# Patient Record
Sex: Female | Born: 1985
Health system: Southern US, Community
[De-identification: ages and names within clinical notes are randomized; demographics above are authoritative.]

## PROBLEM LIST (undated history)

## (undated) HISTORY — PX: NO PAST SURGERIES: SHX2092

---

## 2005-08-10 ENCOUNTER — Ambulatory Visit (HOSPITAL_COMMUNITY): Admission: RE | Admit: 2005-08-10 | Discharge: 2005-08-10 | Payer: Self-pay | Admitting: Gynecology

## 2005-08-14 ENCOUNTER — Ambulatory Visit: Payer: Self-pay | Admitting: Family Medicine

## 2005-09-11 ENCOUNTER — Ambulatory Visit: Payer: Self-pay | Admitting: Family Medicine

## 2005-10-09 ENCOUNTER — Ambulatory Visit: Payer: Self-pay | Admitting: Family Medicine

## 2005-10-23 ENCOUNTER — Ambulatory Visit: Payer: Self-pay | Admitting: Family Medicine

## 2005-11-06 ENCOUNTER — Ambulatory Visit: Payer: Self-pay | Admitting: Family Medicine

## 2005-11-20 ENCOUNTER — Ambulatory Visit: Payer: Self-pay | Admitting: Family Medicine

## 2005-12-11 ENCOUNTER — Ambulatory Visit: Payer: Self-pay | Admitting: Family Medicine

## 2005-12-18 ENCOUNTER — Ambulatory Visit: Payer: Self-pay | Admitting: Family Medicine

## 2005-12-25 ENCOUNTER — Ambulatory Visit: Payer: Self-pay | Admitting: Family Medicine

## 2006-01-08 ENCOUNTER — Inpatient Hospital Stay (HOSPITAL_COMMUNITY): Admission: AD | Admit: 2006-01-08 | Discharge: 2006-01-09 | Payer: Self-pay | Admitting: Obstetrics & Gynecology

## 2006-01-08 ENCOUNTER — Ambulatory Visit: Payer: Self-pay | Admitting: *Deleted

## 2006-02-19 ENCOUNTER — Ambulatory Visit: Payer: Self-pay | Admitting: Family Medicine

## 2006-11-19 ENCOUNTER — Encounter: Payer: Self-pay | Admitting: Family Medicine

## 2006-11-19 ENCOUNTER — Ambulatory Visit: Payer: Self-pay | Admitting: Family Medicine

## 2007-11-10 IMAGING — US US OB COMP +14 WK
1 series · 13 of 28 positions shown · non-contrast
Comparison: none

CLINICAL DATA: 17 week 5 day gestational age by LMP.  Evaluate dating and anatomy.

[Series 1: us ob comp +14 wk · 0.29mm/px · 13 of 79 slices shown]
[im 3/79]
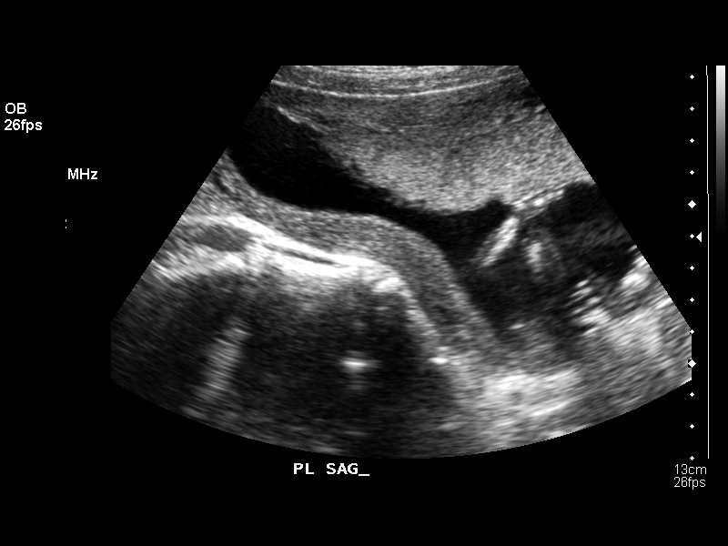
[im 9/79]
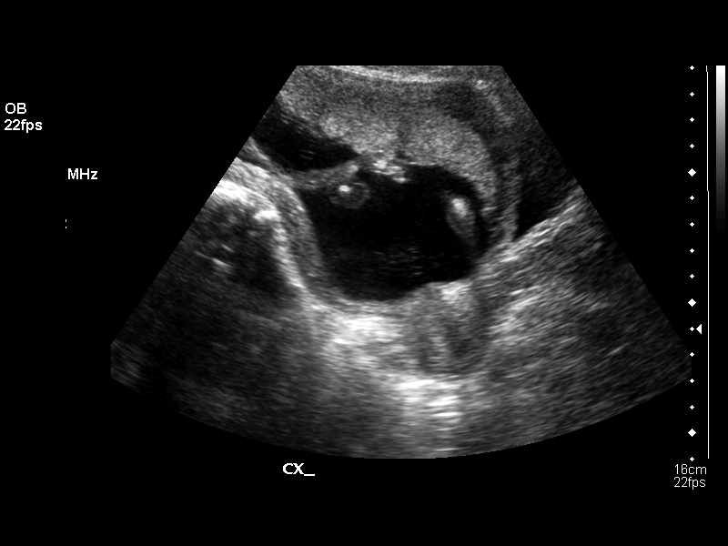
[im 15/79]
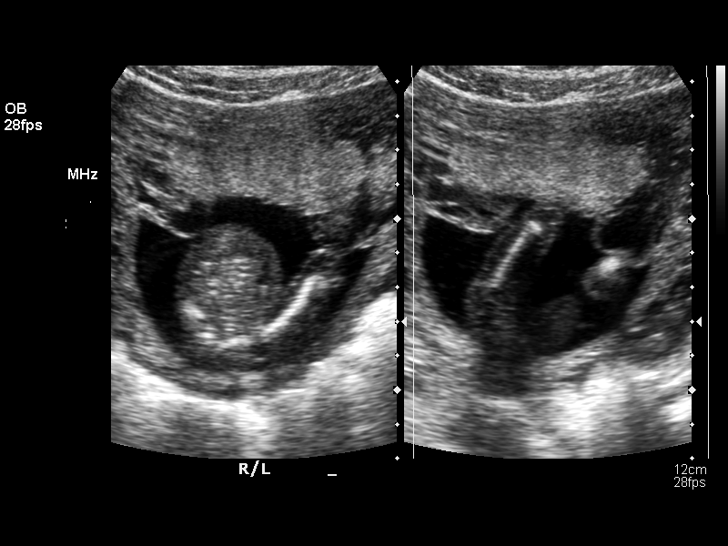
[im 21/79]
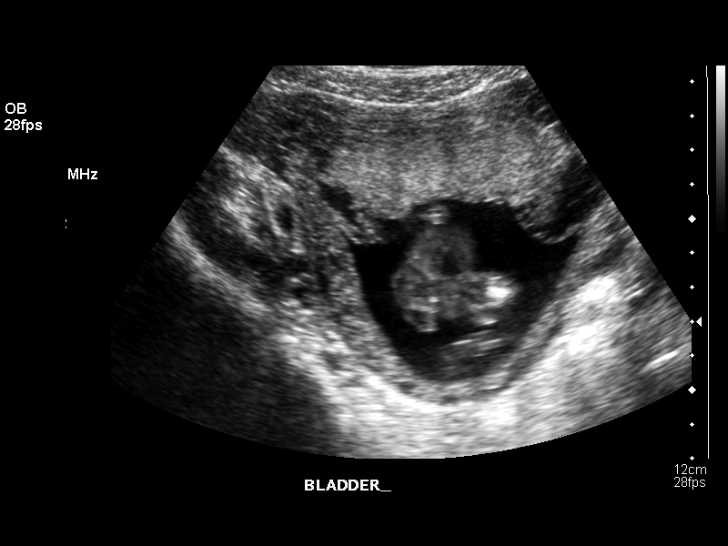
[im 27/79]
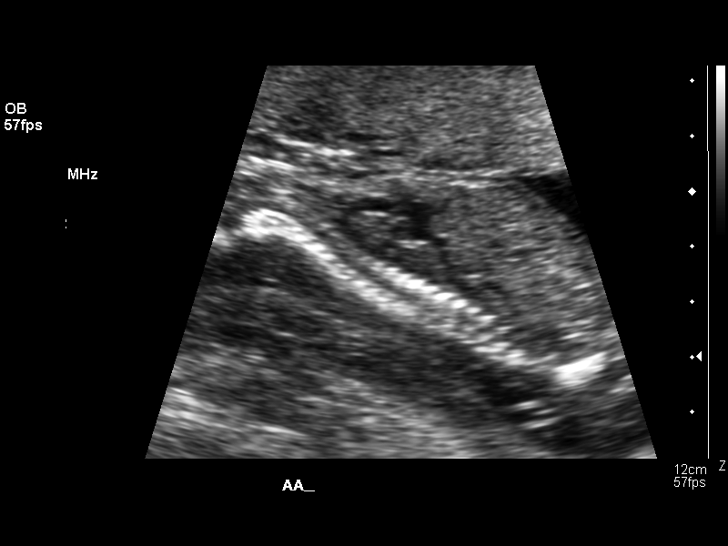
[im 32/79]
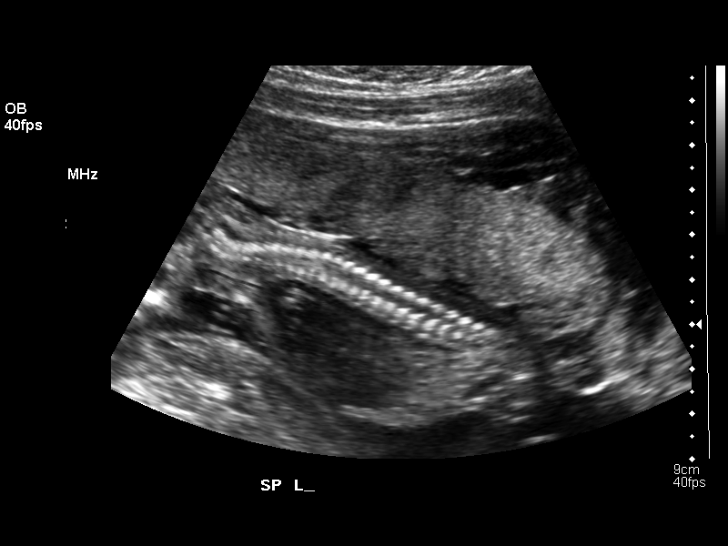
[im 41/79]
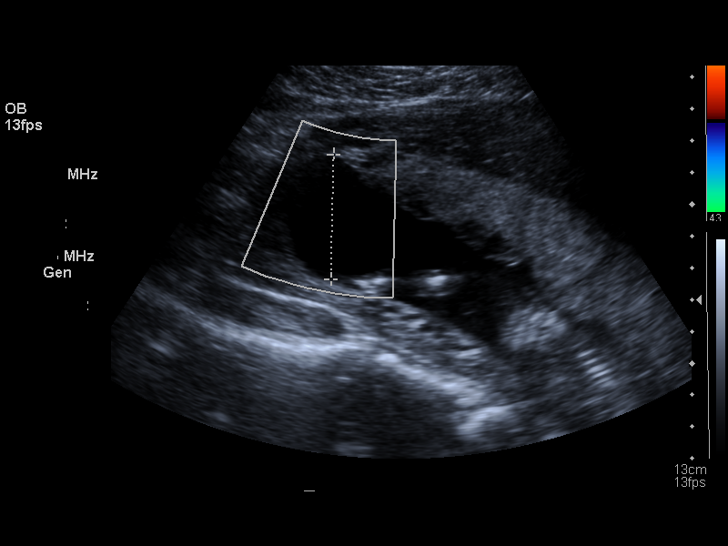
[im 47/79]
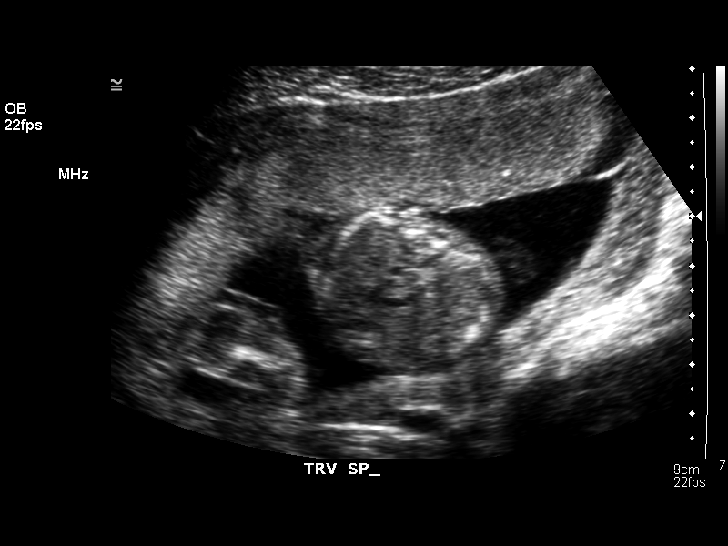
[im 53/79]
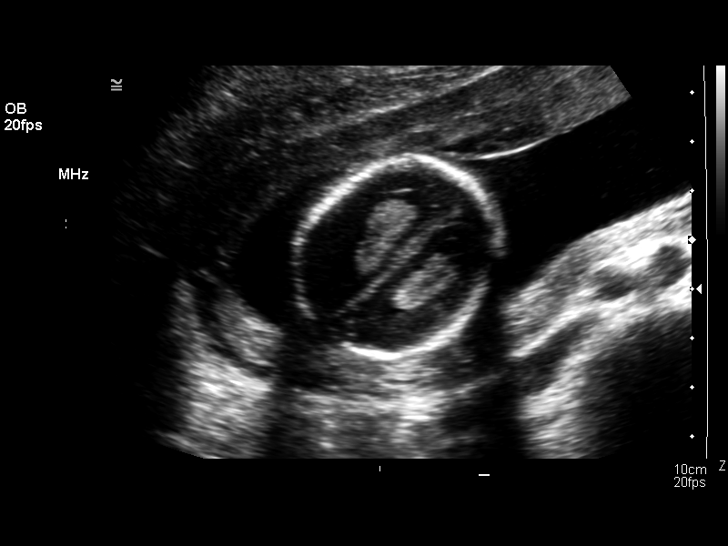
[im 58/79]
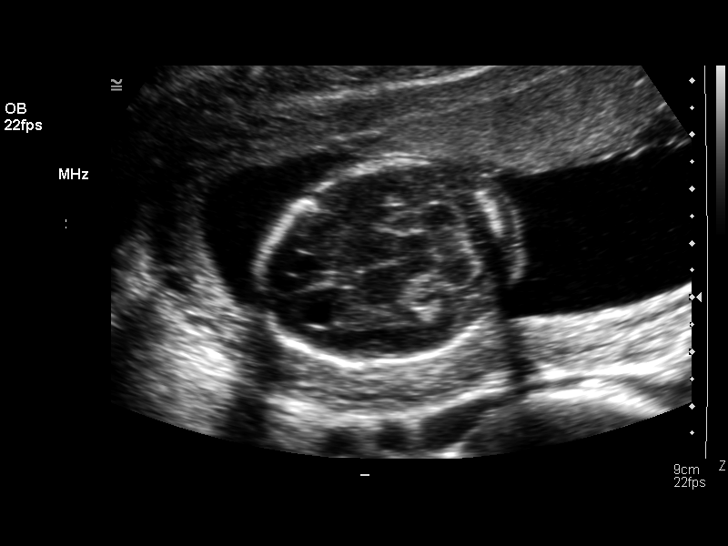
[im 64/79]
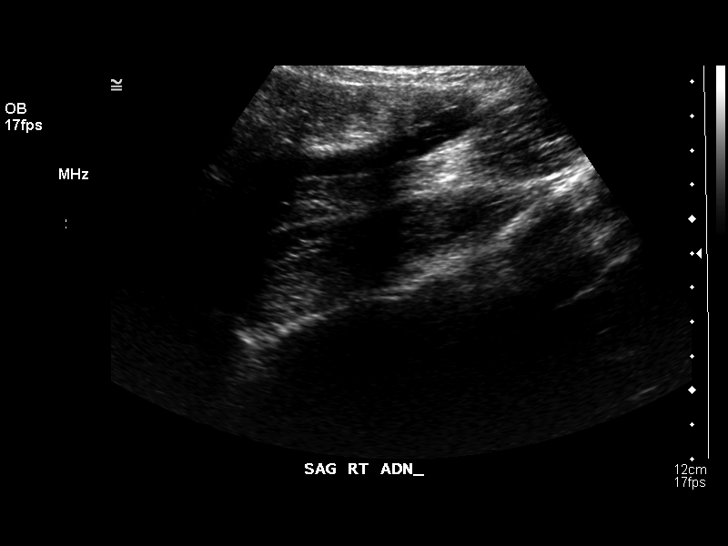
[im 70/79]
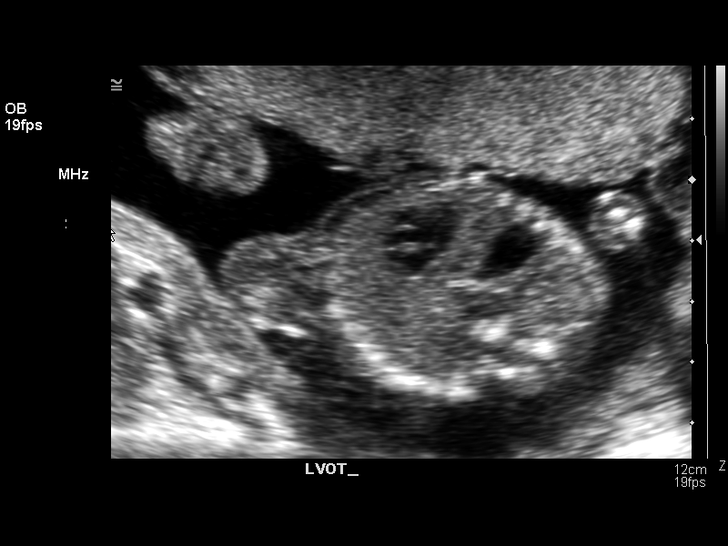
[im 76/79]
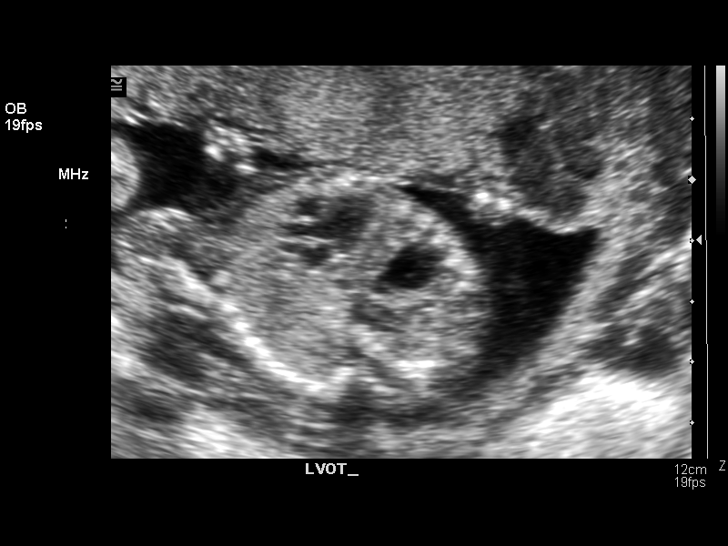

[13 of 28 positions shown; findings below may reference images not displayed]

OBSTETRICAL ULTRASOUND:
 Number of Fetuses: 1
 Heart Rate:  147
 Movement:  Yes
 Breathing:  No  
 Presentation:  Breech
 Placental Location:  Anterior
 Grade:  I
 Previa:  No
 Amniotic Fluid (Subjective):  Normal
 Amniotic Fluid (Objective):   3.9 cm Vertical pocket 

 FETAL BIOMETRY
 BPD:   3.7 cm  17 w 2 d
 HC:   14.4 cm  17 w 4 d
 AC:   12.3 cm  18 w 0 d
 FL:    2.6 cm  17 w 6 d

 MEAN GA:  17 w 5 d  US EDC:  01/13/06

 FETAL ANATOMY
 Lateral Ventricles:    Visualized 
 Thalami/CSP:      Visualized 
 Posterior Fossa:  Visualized 
 Nuchal Region:    Visualized 
 Spine:      Visualized 
 4 Chamber Heart on Left:      Visualized 
 Stomach on Left:      Visualized 
 3 Vessel Cord:    Visualized 
 Cord Insertion site:    Visualized 
 Kidneys:  Visualized 
 Bladder:  Visualized 
 Extremities:      Visualized 

 ADDITIONAL ANATOMY VISUALIZED:  LVOT, RVOT, upper lip, orbits, diaphragm, heel, 5th digit, and aortic arch.

 Evaluation limited by:  Fetal position.

 MATERNAL UTERINE AND ADNEXAL FINDINGS
 Cervix: 3.8 cm Transabdominally.  Both ovaries are unremarkable.
IMPRESSION: 1.  Single living intrauterine fetus with mean gestational age of 17 weeks 5 days and sonographic EDC of 01/13/06.  This matches LMP.  
 2.  No evidence of fetal anatomic abnormality.

## 2010-06-20 ENCOUNTER — Ambulatory Visit
Admission: RE | Admit: 2010-06-20 | Discharge: 2010-06-20 | Payer: Self-pay | Source: Home / Self Care | Attending: Family Medicine | Admitting: Family Medicine

## 2010-06-21 ENCOUNTER — Encounter: Payer: Self-pay | Admitting: Family Medicine

## 2010-06-21 LAB — CONVERTED CEMR LAB
AST: 17 units/L (ref 0–37)
BUN: 13 mg/dL (ref 6–23)
CO2: 26 meq/L (ref 19–32)
Calcium: 9.7 mg/dL (ref 8.4–10.5)
Creatinine, Ser: 0.72 mg/dL (ref 0.40–1.20)
HCT: 44.1 % (ref 36.0–46.0)
Hemoglobin: 13.7 g/dL (ref 12.0–15.0)
MCV: 99.1 fL (ref 78.0–100.0)
Platelets: 232 10*3/uL (ref 150–400)
RDW: 13.5 % (ref 11.5–15.5)
WBC: 8.3 10*3/uL (ref 4.0–10.5)

## 2010-10-17 NOTE — Assessment & Plan Note (Signed)
[name], [name]               ACCOUNT NO.:  [id number]   MEDICAL RECORD NO.:  [id number]          PATIENT TYPE:  POB   LOCATION:  [facility]         FACILITY:  [facility]   PHYSICIAN:  Matt Holmes, N.P.       DATE OF BIRTH:  1985-10-31   DATE OF SERVICE:  11/19/2006                                  CLINIC NOTE   HISTORY OF PRESENT ILLNESS:  Mrs. Joanna Nixon presents for her annual exam  today.  She voices no complaints with the exception of occasional  dizziness 1 day after her periods which only last for 1 day and only  occurs every 3 to 4 months.  She feels this is a vitamin deficiency and  requests vitamins.   PAST MEDICAL HISTORY:  Noncontributory.   SURGERY:  Denies.   FAMILY HISTORY:  Denies.   MEDICATIONS:  She is currently on no medications.   GYN history:  She delivered 10 months ago a viable female.  At this time  she is using condoms for birth control.  If a pregnancy should result  she is okay with this.   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   EXAMINATION:  VITAL SIGNS:  Pulse 75, blood pressure 117/71, weight 120,  height 5 feet 3 inches tall.  She denies allergies to food or medicines.  The first day of her last  period was November 17, 2006.  GENERAL:  She is a well developed, well nourished white female in no  acute distress.  HEENT:  Within normal limits.  No thyroid enlargement.  BREASTS:  Bilaterally soft without masses, nodes or nipple discharge.  LUNGS:  Bilaterally clear.  HEART:  Rate and rhythm were regular with a split second sound.  ABDOMEN:  Soft, nontender, nondistended.  PELVIC:  External genitalia within normal limits for female.  Vagina is  clean and rugated.  Cervix is parousand clean.  The uterus is normal  shape, size and contour.  Adnexa is bilaterally clear without masses or  tenderness.  RECTAL/VAGINAL EXAM:  Deferred.  EXTREMITIES:  Within normal limits.   IMPRESSION:  1. Normal annual  exam.  2. Condoms for contraception.   PLAN:  1. Prenatal vitamins given to patient in case a pregnancy should      occur.  She is to call the office is she misses a period for a      pregnancy test.  2. Pap smear should be back in 1 to 2 weeks.  Results will be called      to the patient if they are abnormal.  3. Return to the office in 1 year for follow-up for annual exam.           ______________________________  Matt Holmes, N.P.     EMK/MEDQ  D:  11/19/2006  T:  11/20/2006  Job:  [id number]

## 2010-10-17 NOTE — Assessment & Plan Note (Signed)
NAME:  Joanna Nixon, Joanna Nixon NO.:  [id number]   MEDICAL RECORD NO.:  [id number]          PATIENT TYPE:  POB   LOCATION:  [facility]         FACILITY:  [facility]   PHYSICIAN:  Tinnie Gens, MD        DATE OF BIRTH:  05-22-1986   DATE OF SERVICE:  06/20/2010                                  CLINIC NOTE   CHIEF COMPLAINT:  Yearly exam.   HISTORY OF PRESENT ILLNESS:  The patient is a 25 year old gravida 1,  para 1 who comes in today for yearly exam.  She has not been seen in  this office since 2008.  She complains of fatigue for several years.  She took her multivitamin but has gotten no real relief.  She states her  fatigue is worse after her cycle, but her periods are regular and not  heavy, they last 3-4 days.  She thinks she gets adequate sleep.  She  sleeps from 9-10 p.m. to 6 a.m. without snoring or being told that she  awakens.  She is under some stress in her family life.  She split with  her husband, however, they have gotten back together.  But she is not on  any counseling for this, but she denies severe anxiety or constant worry  or death.  She does report some times when she is in enclosed spaces  feels somewhat dizzy but not having true panic attacks or felt like she  is going to die and then this will just subsequently pass without doing  much about that.  She thinks that this will be all be in her head.  Otherwise, she is without significant complaint.   PAST MEDICAL HISTORY:  She is not using birth control, that her partner  pulls out.   PAST MEDICAL HISTORY:  Negative.   PAST SURGICAL HISTORY:  Negative.   FAMILY HISTORY:  Anemia in her mom.   MEDICATIONS:  She is on multivitamin daily.   ALLERGIES:  None known.   OBSTETRICAL HISTORY:  G1, P1, with one vaginal delivery of a female  infant.   GYN HISTORY:  No history of abnormal Pap or STDs.   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   MEDICATIONS:  Multivitamin daily.   REVIEW OF SYSTEMS:  Fourteen point review of systems reviewed.  She  denies headache, vision changes, nausea, vomiting, diarrhea,  constipation, fevers, chills, chest pain, shortness of breath, blood in  her stool, blood in her urine, or depression.   PHYSICAL EXAMINATION:  VITAL SIGNS:  On exam, vitals are as noted in the  chart.  GENERAL:  She is a well-developed, well-nourished female in no acute  distress.  HEENT:  Normocephalic, atraumatic.  Sclerae anicteric.  NECK:  Supple.  Normal thyroid.  LUNGS:  Clear bilaterally.  CV:  Regular rate and rhythm.  No rubs, gallops, or murmurs.  ABDOMEN:  Soft, nontender, nondistended.  EXTREMITIES:  No cyanosis, clubbing, or edema.  She had 2+ distal  pulses.  BREASTS:  Symmetric with everted nipples.  No masses.  No  supraclavicular or axillary adenopathy.  GU:  Normal external female genitalia.  BUS normal.  Vagina is pink and  rugated.  Cervix is parous without lesion.  Uterus is small, anteverted.  No adnexal mass or tenderness with good exam performed.   IMPRESSION:  1. Gynecologic exam with Pap.  2. Fatigue.   PLAN:  1. Check CBC, TSH, and CMP.  2. Continue multivitamin.  3. Advised Pap smear today.  4. I have asked to seek counseling regarding stress in her life as      this may be a contributing factor to some of her complaints.           ______________________________  Tinnie Gens, MD     TP/MEDQ  D:  06/20/2010  T:  06/21/2010  Job:  [id number]

## 2010-10-20 NOTE — Assessment & Plan Note (Signed)
NAME:  Joanna Nixon, Joanna Nixon NO.:  [id number]   MEDICAL RECORD NO.:  [id number]          PATIENT TYPE:  POB   LOCATION:  [facility] at [facility]         FACILITY:  [facility]   PHYSICIAN:  Tinnie Gens, MD        DATE OF BIRTH:  May 18, 1986   DATE OF SERVICE:                                    CLINIC NOTE   CHIEF COMPLAINT:  Postpartum check.   HISTORY OF PRESENT ILLNESS:  The patient is a 25 year old gravida 1, para 1  who is 6 weeks status post spontaneous vaginal delivery of a female infant.  She is not currently breast-feeding.  She is using condoms for birth  control.  The patient reports she has been bleeding on and off for the last  6 weeks although it has tapered off over the last 3 days.  Patient denies  sad mood.  She reports the baby is sleeping through the night and she feels  well.   EXAMINATION:  VITAL SIGNS:  As noted in the chart.  GENERAL:  She is a well-developed, well-nourished female in no acute  distress.  ABDOMEN:  Soft, nontender, nondistended.  GU:  Normal external female genitalia.  Vagina is pink and rugated.  Cervix  is parous without lesion.  The uterus is small, anteverted.  The adnexa are  without masses or tenderness.   IMPRESSION:  Normal postpartum exam.   PLAN:  1. The patient needs a pap smear in 2008.  2. Ortho Tri-Cyclen Lo given to patient.  She reports migraine headaches      with OCs in the past so if this does continue to be an issue, I have      given her info on other types and forms of birth control.           ______________________________  Tinnie Gens, MD     TP/MEDQ  D:  02/19/2006  T:  02/20/2006  Job:  [id number]

## 2013-03-13 ENCOUNTER — Ambulatory Visit (INDEPENDENT_AMBULATORY_CARE_PROVIDER_SITE_OTHER): Payer: No Typology Code available for payment source | Admitting: Obstetrics & Gynecology

## 2013-03-13 ENCOUNTER — Encounter: Payer: Self-pay | Admitting: Obstetrics & Gynecology

## 2013-03-13 VITALS — BP 110/81 | HR 74 | Ht 63.0 in | Wt 119.0 lb

## 2013-03-13 DIAGNOSIS — N760 Acute vaginitis: Secondary | ICD-10-CM

## 2013-03-13 DIAGNOSIS — B9689 Other specified bacterial agents as the cause of diseases classified elsewhere: Secondary | ICD-10-CM

## 2013-03-13 DIAGNOSIS — N898 Other specified noninflammatory disorders of vagina: Secondary | ICD-10-CM

## 2013-03-13 DIAGNOSIS — A499 Bacterial infection, unspecified: Secondary | ICD-10-CM

## 2013-03-13 DIAGNOSIS — N949 Unspecified condition associated with female genital organs and menstrual cycle: Secondary | ICD-10-CM

## 2013-03-13 NOTE — Patient Instructions (Addendum)
Return to clinic for any scheduled appointments or for any gynecologic concerns as needed.  Thank you for enrolling in MyChart. Please follow the instructions below to securely access your online medical record. MyChart allows you to send messages to your doctor, view your test results, manage appointments, and more.   How Do I Sign Up? 1. In your Internet browser, go to [employer] and enter [email or web address]. 2. Click on the Sign Up Now link in the Sign In box. You will see the New Member Sign Up page. 3. Enter your MyChart Access Code exactly as it appears below. You will not need to use this code after you've completed the sign-up process. If you do not sign up before the expiration date, you must request a new code.  MyChart Access Code: [email or web address] Expires: 05/12/2013 11:18 AM  4. Enter your Social Security Number ([id number]) and Date of Birth (mm/dd/yyyy) as indicated and click Submit. You will be taken to the next sign-up page. 5. Create a MyChart ID. This will be your MyChart login ID and cannot be changed, so think of one that is secure and easy to remember. 6. Create a MyChart password. You can change your password at any time. 7. Enter your Password Reset Question and Answer. This can be used at a later time if you forget your password.  8. Enter your e-mail address. You will receive e-mail notification when new information is available in MyChart. 9. Click Sign Up. You can now view your medical record.   Additional Information Remember, MyChart is NOT to be used for urgent needs. For medical emergencies, dial 911.

## 2013-03-13 NOTE — Progress Notes (Signed)
GYNECOLOGY CLINIC ENCOUNTER NOTE  History:  27 y.o. G1P1001 here today for vaginal odor for a few weeks.  No vaginal discharge noted, no pruritus.  Happens more after menstrual periods.  The following portions of the patient's history were reviewed and updated as appropriate: allergies, current medications, past family history, past medical history, past social history, past surgical history and problem list.  Last pap smear was four years ago.  Review of Systems:  Pertinent items are noted in HPI.  Objective:  Physical Exam BP 110/81  Pulse 74  Ht 5\' 3"  (1.6 m)  Wt 119 lb (53.978 kg)  BMI 21.09 kg/m2  LMP 03/06/2013 Gen: NAD Abd: Soft, nontender and nondistended Pelvic: Normal appearing external genitalia; normal appearing vaginal mucosa and cervix.  Brown, malodorous discharge noted; wet prep obtained.  Small uterus, no other palpable masses, no uterine or adnexal tenderness  Assessment & Plan:  Likely bacterial vaginosis, counseled patient about proper vulvar hygiene, reestablishing proper vaginal pH. RepHresh samples given to patient.  Will follow up results of wet prep and manage accordingly. Return as needed or for annual exam

## 2013-03-14 LAB — WET PREP, GENITAL

## 2013-03-14 MED ORDER — METRONIDAZOLE 500 MG PO TABS: 500.0000 mg | ORAL_TABLET | Freq: Two times a day (BID) | ORAL | Status: DC

## 2013-03-14 NOTE — Addendum Note (Signed)
Addended by: Jaynie Collins A on: 03/14/2013 11:06 AM   Modules accepted: Orders

## 2013-04-01 ENCOUNTER — Ambulatory Visit (INDEPENDENT_AMBULATORY_CARE_PROVIDER_SITE_OTHER): Payer: No Typology Code available for payment source | Admitting: Obstetrics & Gynecology

## 2013-04-01 ENCOUNTER — Encounter: Payer: Self-pay | Admitting: Obstetrics & Gynecology

## 2013-04-01 VITALS — BP 122/78 | HR 79 | Ht 63.0 in | Wt 121.0 lb

## 2013-04-01 DIAGNOSIS — N898 Other specified noninflammatory disorders of vagina: Secondary | ICD-10-CM

## 2013-04-01 DIAGNOSIS — B9689 Other specified bacterial agents as the cause of diseases classified elsewhere: Secondary | ICD-10-CM

## 2013-04-01 DIAGNOSIS — N949 Unspecified condition associated with female genital organs and menstrual cycle: Secondary | ICD-10-CM

## 2013-04-01 DIAGNOSIS — A499 Bacterial infection, unspecified: Secondary | ICD-10-CM

## 2013-04-01 DIAGNOSIS — N760 Acute vaginitis: Secondary | ICD-10-CM

## 2013-04-01 DIAGNOSIS — Z0000 Encounter for general adult medical examination without abnormal findings: Secondary | ICD-10-CM

## 2013-04-01 DIAGNOSIS — Z01419 Encounter for gynecological examination (general) (routine) without abnormal findings: Secondary | ICD-10-CM

## 2013-04-01 DIAGNOSIS — Z1151 Encounter for screening for human papillomavirus (HPV): Secondary | ICD-10-CM

## 2013-04-01 DIAGNOSIS — Z124 Encounter for screening for malignant neoplasm of cervix: Secondary | ICD-10-CM

## 2013-04-01 DIAGNOSIS — R011 Cardiac murmur, unspecified: Secondary | ICD-10-CM

## 2013-04-01 MED ORDER — METRONIDAZOLE 500 MG PO TABS: 500.0000 mg | ORAL_TABLET | Freq: Two times a day (BID) | ORAL | Status: DC

## 2013-04-01 NOTE — Patient Instructions (Signed)

## 2013-04-01 NOTE — Progress Notes (Signed)
Subjective:    Joanna Nixon is a 27 y.o. female who presents for an annual exam. The patient has no complaints today. She thinks that the BV may have returned. The patient is sexually active. GYN screening history: last pap: was normal. The patient wears seatbelts: yes. The patient participates in regular exercise: yes. Has the patient ever been transfused or tattooed?: no. The patient reports that there is not domestic violence in her life.   Menstrual History: OB History   Grav Para Term Preterm Abortions TAB SAB Ect Mult Living   1 1 1       1       Menarche age: 31 Coitarche: 6   Patient's last menstrual period was 03/06/2013.    The following portions of the patient's history were reviewed and updated as appropriate: allergies, current medications, past family history, past medical history, past social history, past surgical history and problem list.  Review of Systems A comprehensive review of systems was negative. 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 Lives with husband of 7 years and daughter. Uses withdrawal for birth control.   Objective:    BP 122/78  Pulse 79  Ht 5\' 3"  (1.6 m)  Wt 121 lb (54.885 kg)  BMI 21.44 kg/m2  LMP 03/06/2013  General Appearance:    Alert, cooperative, no distress, appears stated age  Head:    Normocephalic, without obvious abnormality, atraumatic  Eyes:    PERRL, conjunctiva/corneas clear, EOM's intact, fundi    benign, both eyes  Ears:    Normal TM's and external ear canals, both ears  Nose:   Nares normal, septum midline, mucosa normal, no drainage    or sinus tenderness  Throat:   Lips, mucosa, and tongue normal; teeth and gums normal  Neck:   Supple, symmetrical, trachea midline, no adenopathy;    thyroid:  no enlargement/tenderness/nodules; no carotid   bruit or JVD  Back:     Symmetric, no curvature, ROM normal, no CVA tenderness  Lungs:     Clear to auscultation bilaterally, respirations unlabored   Chest Wall:    No tenderness or deformity   Heart:     Mid systolic murmur and possibly also a click  Breast Exam:    No tenderness, masses, or nipple abnormality  Abdomen:     Soft, non-tender, bowel sounds active all four quadrants,    no masses, no organomegaly  Genitalia:    Normal female without lesion, discharge or tenderness, NSSA, NT, mobile, normal adnexal exam     Extremities:   Extremities normal, atraumatic, no cyanosis or edema  Pulses:   2+ and symmetric all extremities  Skin:   Skin color, texture, turgor normal, no rashes or lesions  Lymph nodes:   Cervical, supraclavicular, and axillary nodes normal  Neurologic:   CNII-XII intact, normal strength, sensation and reflexes    throughout  .    Assessment:    Healthy female exam.  Probable return of BV New onset heart murmur/click   Plan:     Breast self exam technique reviewed and patient encouraged to perform self-exam monthly. Thin prep Pap smear. with cotesting Rec MVI with folic acid for prevention of ONTDs. Refill flagyl

## 2013-06-04 NOTE — L&D Delivery Note (Signed)
Delivery Note Pt became complete and pushed with a couple of ctx; at 11:34 PM a viable female was delivered via Vaginal, Spontaneous Delivery (Presentation: Left Occiput Anterior).  APGAR: 9, 9; weight: pending. Cord clamped and cut by FOB. Hospital cord blood sample collected. Placenta status: Intact, Spontaneous.  Cord:  3 vessel  Anesthesia: Epidural  Episiotomy: None Lacerations: None Est. Blood Loss (mL): 200  Mom to postpartum.  Baby to Couplet care / Skin to Skin.  Cam HaiSHAW, Arfa Lamarca CNM 04/01/2014, 12:27 AM

## 2013-06-04 NOTE — L&D Delivery Note (Signed)
Attestation of Attending Supervision of Fellow: Evaluation and management procedures were performed by the Fellow under my supervision and collaboration.  I have reviewed the Fellow's note and chart, and I agree with the management and plan.    

## 2013-08-13 ENCOUNTER — Ambulatory Visit (INDEPENDENT_AMBULATORY_CARE_PROVIDER_SITE_OTHER): Payer: No Typology Code available for payment source | Admitting: *Deleted

## 2013-08-13 ENCOUNTER — Encounter: Payer: Self-pay | Admitting: *Deleted

## 2013-08-13 VITALS — BP 110/64 | Wt 126.0 lb

## 2013-08-13 DIAGNOSIS — Z3480 Encounter for supervision of other normal pregnancy, unspecified trimester: Secondary | ICD-10-CM

## 2013-08-13 NOTE — Progress Notes (Signed)
P-85.  New ob patient is having just a small amount of nausea usually in the mornings.  She does feel like she needs anything for this at this point.  She will keep us notified if her symptoms get worse.  New OB labs were drawn today and she will follow up with the physician in approximately two weeks for her New OB appointment.  She will call the office if she has any concerns between now and then.

## 2013-08-13 NOTE — Progress Notes (Deleted)
P-85 

## 2013-08-13 NOTE — Patient Instructions (Signed)

## 2013-08-14 ENCOUNTER — Encounter: Payer: Self-pay | Admitting: Obstetrics & Gynecology

## 2013-08-14 DIAGNOSIS — O9989 Other specified diseases and conditions complicating pregnancy, childbirth and the puerperium: Secondary | ICD-10-CM

## 2013-08-14 DIAGNOSIS — Z283 Underimmunization status: Secondary | ICD-10-CM | POA: Insufficient documentation

## 2013-08-14 DIAGNOSIS — Z2839 Other underimmunization status: Secondary | ICD-10-CM | POA: Insufficient documentation

## 2013-08-14 LAB — OBSTETRIC PANEL
Antibody Screen: NEGATIVE
BASOS PCT: 0 % (ref 0–1)
Basophils Absolute: 0 10*3/uL (ref 0.0–0.1)
Eosinophils Absolute: 0 10*3/uL (ref 0.0–0.7)
Eosinophils Relative: 0 % (ref 0–5)
HEMATOCRIT: 37.1 % (ref 36.0–46.0)
HEMOGLOBIN: 12.6 g/dL (ref 12.0–15.0)
HEP B S AG: NEGATIVE
LYMPHS ABS: 1.9 10*3/uL (ref 0.7–4.0)
LYMPHS PCT: 17 % (ref 12–46)
MCH: 30.3 pg (ref 26.0–34.0)
MCHC: 34 g/dL (ref 30.0–36.0)
MCV: 89.2 fL (ref 78.0–100.0)
Monocytes Absolute: 0.6 10*3/uL (ref 0.1–1.0)
Monocytes Relative: 5 % (ref 3–12)
NEUTROS ABS: 8.8 10*3/uL — AB (ref 1.7–7.7)
NEUTROS PCT: 78 % — AB (ref 43–77)
Platelets: 205 10*3/uL (ref 150–400)
RBC: 4.16 MIL/uL (ref 3.87–5.11)
RDW: 13.4 % (ref 11.5–15.5)
Rh Type: POSITIVE
Rubella: 0.89 Index (ref <0.90)
WBC: 11.3 10*3/uL — AB (ref 4.0–10.5)

## 2013-08-14 LAB — HIV ANTIBODY (ROUTINE TESTING W REFLEX): HIV: NONREACTIVE

## 2013-08-15 LAB — CULTURE, OB URINE
COLONY COUNT: NO GROWTH
ORGANISM ID, BACTERIA: NO GROWTH

## 2013-08-31 ENCOUNTER — Encounter: Payer: Self-pay | Admitting: Family Medicine

## 2013-08-31 ENCOUNTER — Ambulatory Visit (INDEPENDENT_AMBULATORY_CARE_PROVIDER_SITE_OTHER): Payer: No Typology Code available for payment source | Admitting: Family Medicine

## 2013-08-31 VITALS — BP 108/64 | Wt 125.0 lb

## 2013-08-31 DIAGNOSIS — Z113 Encounter for screening for infections with a predominantly sexual mode of transmission: Secondary | ICD-10-CM

## 2013-08-31 DIAGNOSIS — Z3480 Encounter for supervision of other normal pregnancy, unspecified trimester: Secondary | ICD-10-CM

## 2013-08-31 NOTE — Progress Notes (Signed)
   Subjective:    Joanna Nixon is a G2P1001 3267w4d being seen today for her first obstetrical visit.  Her obstetrical history is not significant. Pregnancy history fully reviewed.  Patient reports no complaints.  Filed Vitals:   08/31/13 1549  BP: 108/64  Weight: 125 lb (56.7 kg)    HISTORY: OB History  Gravida Para Term Preterm AB SAB TAB Ectopic Multiple Living  2 1 1       1     # Outcome Date GA Lbr Len/2nd Weight Sex Delivery Anes PTL Lv  2 CUR           1 TRM 01/08/06 7044w0d  6 lb 1 oz (2.75 kg) F SVD EPI N Y     History reviewed. No pertinent past medical history. Past Surgical History  Procedure Laterality Date  . No past surgeries     Family History  Problem Relation Age of Onset  . Hyperlipidemia Mother   . Hyperlipidemia Father      Exam    Uterus:     Pelvic Exam:    Perineum: Normal Perineum   Vulva: Bartholin's, Urethra, Skene's normal   Vagina:  normal mucosa, normal discharge   Cervix: no cervical motion tenderness and no lesions   Adnexa: normal adnexa   Bony Pelvis: average  System: Breast:  normal appearance, no masses or tenderness   Skin: normal coloration and turgor, no rashes    Neurologic: normal   Extremities: normal strength, tone, and muscle mass   HEENT sclera clear, anicteric   Mouth/Teeth mucous membranes moist, pharynx normal without lesions   Neck supple   Cardiovascular: regular rate and rhythm, no murmurs or gallops   Respiratory:  appears well, vitals normal, no respiratory distress, acyanotic, normal RR, ear and throat exam is normal, neck free of mass or lymphadenopathy, chest clear, no wheezing, crepitations, rhonchi, normal symmetric air entry   Abdomen: soft, non-tender; bowel sounds normal; no masses,  no organomegaly      Assessment:    Pregnancy: G2P1001 Patient Active Problem List   Diagnosis Date Noted  . Supervision of other normal pregnancy 08/31/2013    Priority: High  . Rubella non-immune status,  antepartum 08/14/2013    Priority: Medium     TVUS: SIUP at 8 wk 3 days, + flicker, nml ovaries.   Plan:     Initial labs reviewed. Prenatal vitamins. Problem list reviewed and updated. Genetic Screening discussed First Screen: declined.  Ultrasound discussed; fetal survey: discussed.  Follow up in 4 weeks.   PRATT,TANYA S 08/31/2013

## 2013-08-31 NOTE — Patient Instructions (Signed)
Pregnancy - First Trimester During sexual intercourse, millions of sperm go into the vagina. Only 1 sperm will penetrate and fertilize the female egg while it is in the Fallopian tube. One week later, the fertilized egg implants into the wall of the uterus. An embryo begins to develop into a baby. At 6 to 8 weeks, the eyes and face are formed and the heartbeat can be seen on ultrasound. At the end of 12 weeks (first trimester), all the baby's organs are formed. Now that you are pregnant, you will want to do everything you can to have a healthy baby. Two of the most important things are to get good prenatal care and follow your caregiver's instructions. Prenatal care is all the medical care you receive before the baby's birth. It is given to prevent, find, and treat problems during the pregnancy and childbirth. PRENATAL EXAMS  During prenatal visits, your weight, blood pressure, and urine are checked. This is done to make sure you are healthy and progressing normally during the pregnancy.  A pregnant woman should gain 25 to 35 pounds during the pregnancy. However, if you are overweight or underweight, your caregiver will advise you regarding your weight.  Your caregiver will ask and answer questions for you.  Blood work, cervical cultures, other necessary tests, and a Pap test are done during your prenatal exams. These tests are done to check on your health and the probable health of your baby. Tests are strongly recommended and done for HIV with your permission. This is the virus that causes AIDS. These tests are done because medicines can be given to help prevent your baby from being born with this infection should you have been infected without knowing it. Blood work is also used to find out your blood type, previous infections, and follow your blood levels (hemoglobin).  Low hemoglobin (anemia) is common during pregnancy. Iron and vitamins are given to help prevent this. Later in the pregnancy,  blood tests for diabetes will be done along with any other tests if any problems develop.  You may need other tests to make sure you and the baby are doing well. CHANGES DURING THE FIRST TRIMESTER  Your body goes through many changes during pregnancy. They vary from person to person. Talk to your caregiver about changes you notice and are concerned about. Changes can include:  Your menstrual period stops.  The egg and sperm carry the genes that determine what you look like. Genes from you and your partner are forming a baby. The female genes determine whether the baby is a boy or a girl.  Your body increases in girth and you may feel bloated.  Feeling sick to your stomach (nauseous) and throwing up (vomiting). If the vomiting is uncontrollable, call your caregiver.  Your breasts will begin to enlarge and become tender.  Your nipples may stick out more and become darker.  The need to urinate more. Painful urination may mean you have a bladder infection.  Tiring easily.  Loss of appetite.  Cravings for certain kinds of food.  At first, you may gain or lose a couple of pounds.  You may have changes in your emotions from day to day (excited to be pregnant or concerned something may go wrong with the pregnancy and baby).  You may have more vivid and strange dreams. HOME CARE INSTRUCTIONS   It is very important to avoid all smoking, alcohol and non-prescribed drugs during your pregnancy. These affect the formation and growth of the baby.   Avoid chemicals while pregnant to ensure the delivery of a healthy infant.  Start your prenatal visits by the 12th week of pregnancy. They are usually scheduled monthly at first, then more often in the last 2 months before delivery. Keep your caregiver's appointments. Follow your caregiver's instructions regarding medicine use, blood and lab tests, exercise, and diet.  During pregnancy, you are providing food for you and your baby. Eat regular,  well-balanced meals. Choose foods such as meat, fish, milk and other low fat dairy products, vegetables, fruits, and whole-grain breads and cereals. Your caregiver will tell you of the ideal weight gain.  You can help morning sickness by keeping soda crackers at the bedside. Eat a couple before arising in the morning. You may want to use the crackers without salt on them.  Eating 4 to 5 small meals rather than 3 large meals a day also may help the nausea and vomiting.  Drinking liquids between meals instead of during meals also seems to help nausea and vomiting.  A physical sexual relationship may be continued throughout pregnancy if there are no other problems. Problems may be early (premature) leaking of amniotic fluid from the membranes, vaginal bleeding, or belly (abdominal) pain.  Exercise regularly if there are no restrictions. Check with your caregiver or physical therapist if you are unsure of the safety of some of your exercises. Greater weight gain will occur in the last 2 trimesters of pregnancy. Exercising will help:  Control your weight.  Keep you in shape.  Prepare you for labor and delivery.  Help you lose your pregnancy weight after you deliver your baby.  Wear a good support or jogging bra for breast tenderness during pregnancy. This may help if worn during sleep too.  Ask when prenatal classes are available. Begin classes when they are offered.  Do not use hot tubs, steam rooms, or saunas.  Wear your seat belt when driving. This protects you and your baby if you are in an accident.  Avoid raw meat, uncooked cheese, cat litter boxes, and soil used by cats throughout the pregnancy. These carry germs that can cause birth defects in the baby.  The first trimester is a good time to visit your dentist for your dental health. Getting your teeth cleaned is okay. Use a softer toothbrush and brush gently during pregnancy.  Ask for help if you have financial, counseling, or  nutritional needs during pregnancy. Your caregiver will be able to offer counseling for these needs as well as refer you for other special needs.  Do not take any medicines or herbs unless told by your caregiver.  Inform your caregiver if there is any mental or physical domestic violence.  Make a list of emergency phone numbers of family, friends, hospital, and police and fire departments.  Write down your questions. Take them to your prenatal visit.  Do not douche.  Do not cross your legs.  If you have to stand for long periods of time, rotate you feet or take small steps in a circle.  You may have more vaginal secretions that may require a sanitary pad. Do not use tampons or scented sanitary pads. MEDICINES AND DRUG USE IN PREGNANCY  Take prenatal vitamins as directed. The vitamin should contain 1 milligram of folic acid. Keep all vitamins out of reach of children. Only a couple vitamins or tablets containing iron may be fatal to a baby or young child when ingested.  Avoid use of all medicines, including herbs, over-the-counter medicines, not   prescribed or suggested by your caregiver. Only take over-the-counter or prescription medicines for pain, discomfort, or fever as directed by your caregiver. Do not use aspirin, ibuprofen, or naproxen unless directed by your caregiver.  Let your caregiver also know about herbs you may be using.  Alcohol is related to a number of birth defects. This includes fetal alcohol syndrome. All alcohol, in any form, should be avoided completely. Smoking will cause low birth rate and premature babies.  Street or illegal drugs are very harmful to the baby. They are absolutely forbidden. A baby born to an addicted mother will be addicted at birth. The baby will go through the same withdrawal an adult does.  Let your caregiver know about any medicines that you have to take and for what reason you take them. SEEK MEDICAL CARE IF:  You have any concerns or  worries during your pregnancy. It is better to call with your questions if you feel they cannot wait, rather than worry about them. SEEK IMMEDIATE MEDICAL CARE IF:   An unexplained oral temperature above 102 F (38.9 C) develops, or as your caregiver suggests.  You have leaking of fluid from the vagina (birth canal). If leaking membranes are suspected, take your temperature and inform your caregiver of this when you call.  There is vaginal spotting or bleeding. Notify your caregiver of the amount and how many pads are used.  You develop a bad smelling vaginal discharge with a change in the color.  You continue to feel sick to your stomach (nauseated) and have no relief from remedies suggested. You vomit blood or coffee ground-like materials.  You lose more than 2 pounds of weight in 1 week.  You gain more than 2 pounds of weight in 1 week and you notice swelling of your face, hands, feet, or legs.  You gain 5 pounds or more in 1 week (even if you do not have swelling of your hands, face, legs, or feet).  You get exposed to German measles and have never had them.  You are exposed to fifth disease or chickenpox.  You develop belly (abdominal) pain. Round ligament discomfort is a common non-cancerous (benign) cause of abdominal pain in pregnancy. Your caregiver still must evaluate this.  You develop headache, fever, diarrhea, pain with urination, or shortness of breath.  You fall or are in a car accident or have any kind of trauma.  There is mental or physical violence in your home. Document Released: 05/15/2001 Document Revised: 02/13/2012 Document Reviewed: 11/16/2008 [employer] Patient Information 2014 [employer].  Breastfeeding Deciding to breastfeed is one of the best choices you can make for you and your baby. A change in hormones during pregnancy causes your breast tissue to grow and increases the number and size of your milk ducts. These hormones also allow proteins,  sugars, and fats from your blood supply to make breast milk in your milk-producing glands. Hormones prevent breast milk from being released before your baby is born as well as prompt milk flow after birth. Once breastfeeding has begun, thoughts of your baby, as well as his or her sucking or crying, can stimulate the release of milk from your milk-producing glands.  BENEFITS OF BREASTFEEDING For Your Baby  Your first milk (colostrum) helps your baby's digestive system function better.   There are antibodies in your milk that help your baby fight off infections.   Your baby has a lower incidence of asthma, allergies, and sudden infant death syndrome.   The nutrients   in breast milk are better for your baby than infant formulas and are designed uniquely for your baby's needs.   Breast milk improves your baby's brain development.   Your baby is less likely to develop other conditions, such as childhood obesity, asthma, or type 2 diabetes mellitus.  For You   Breastfeeding helps to create a very special bond between you and your baby.   Breastfeeding is convenient. Breast milk is always available at the correct temperature and costs nothing.   Breastfeeding helps to burn calories and helps you lose the weight gained during pregnancy.   Breastfeeding makes your uterus contract to its prepregnancy size faster and slows bleeding (lochia) after you give birth.   Breastfeeding helps to lower your risk of developing type 2 diabetes mellitus, osteoporosis, and breast or ovarian cancer later in life. SIGNS THAT YOUR BABY IS HUNGRY Early Signs of Hunger  Increased alertness or activity.  Stretching.  Movement of the head from side to side.  Movement of the head and opening of the mouth when the corner of the mouth or cheek is stroked (rooting).  Increased sucking sounds, smacking lips, cooing, sighing, or squeaking.  Hand-to-mouth movements.  Increased sucking of fingers or  hands. Late Signs of Hunger  Fussing.  Intermittent crying. Extreme Signs of Hunger Signs of extreme hunger will require calming and consoling before your baby will be able to breastfeed successfully. Do not wait for the following signs of extreme hunger to occur before you initiate breastfeeding:   Restlessness.  A loud, strong cry.   Screaming. BREASTFEEDING BASICS Breastfeeding Initiation  Find a comfortable place to sit or lie down, with your neck and back well supported.  Place a pillow or rolled up blanket under your baby to bring him or her to the level of your breast (if you are seated). Nursing pillows are specially designed to help support your arms and your baby while you breastfeed.  Make sure that your baby's abdomen is facing your abdomen.   Gently massage your breast. With your fingertips, massage from your chest wall toward your nipple in a circular motion. This encourages milk flow. You may need to continue this action during the feeding if your milk flows slowly.  Support your breast with 4 fingers underneath and your thumb above your nipple. Make sure your fingers are well away from your nipple and your baby's mouth.   Stroke your baby's lips gently with your finger or nipple.   When your baby's mouth is open wide enough, quickly bring your baby to your breast, placing your entire nipple and as much of the colored area around your nipple (areola) as possible into your baby's mouth.   More areola should be visible above your baby's upper lip than below the lower lip.   Your baby's tongue should be between his or her lower gum and your breast.   Ensure that your baby's mouth is correctly positioned around your nipple (latched). Your baby's lips should create a seal on your breast and be turned out (everted).  It is common for your baby to suck about 2 3 minutes in order to start the flow of breast milk. Latching Teaching your baby how to latch on to your  breast properly is very important. An improper latch can cause nipple pain and decreased milk supply for you and poor weight gain in your baby. Also, if your baby is not latched onto your nipple properly, he or she may swallow some air during   feeding. This can make your baby fussy. Burping your baby when you switch breasts during the feeding can help to get rid of the air. However, teaching your baby to latch on properly is still the best way to prevent fussiness from swallowing air while breastfeeding. Signs that your baby has successfully latched on to your nipple:    Silent tugging or silent sucking, without causing you pain.   Swallowing heard between every 3 4 sucks.    Muscle movement above and in front of his or her ears while sucking.  Signs that your baby has not successfully latched on to nipple:   Sucking sounds or smacking sounds from your baby while breastfeeding.  Nipple pain. If you think your baby has not latched on correctly, slip your finger into the corner of your baby's mouth to break the suction and place it between your baby's gums. Attempt breastfeeding initiation again. Signs of Successful Breastfeeding Signs from your baby:   A gradual decrease in the number of sucks or complete cessation of sucking.   Falling asleep.   Relaxation of his or her body.   Retention of a small amount of milk in his or her mouth.   Letting go of your breast by himself or herself. Signs from you:  Breasts that have increased in firmness, weight, and size 1 3 hours after feeding.   Breasts that are softer immediately after breastfeeding.  Increased milk volume, as well as a change in milk consistency and color by the 5th day of breastfeeding.   Nipples that are not sore, cracked, or bleeding. Signs That Your Baby is Getting Enough Milk  Wetting at least 3 diapers in a 24-hour period. The urine should be clear and pale yellow by age 5 days.  At least 3 stools in a  24-hour period by age 5 days. The stool should be soft and yellow.  At least 3 stools in a 24-hour period by age 7 days. The stool should be seedy and yellow.  No loss of weight greater than 10% of birth weight during the first 3 days of age.  Average weight gain of 4 7 ounces (120 210 mL) per week after age 4 days.  Consistent daily weight gain by age 5 days, without weight loss after the age of 2 weeks. After a feeding, your baby may spit up a small amount. This is common. BREASTFEEDING FREQUENCY AND DURATION Frequent feeding will help you make more milk and can prevent sore nipples and breast engorgement. Breastfeed when you feel the need to reduce the fullness of your breasts or when your baby shows signs of hunger. This is called "breastfeeding on demand." Avoid introducing a pacifier to your baby while you are working to establish breastfeeding (the first 4 6 weeks after your baby is born). After this time you may choose to use a pacifier. Research has shown that pacifier use during the first year of a baby's life decreases the risk of sudden infant death syndrome (SIDS). Allow your baby to feed on each breast as long as he or she wants. Breastfeed until your baby is finished feeding. When your baby unlatches or falls asleep while feeding from the first breast, offer the second breast. Because newborns are often sleepy in the first few weeks of life, you may need to awaken your baby to get him or her to feed. Breastfeeding times will vary from baby to baby. However, the following rules can serve as a guide to help you   ensure that your baby is properly fed:  Newborns (babies 4 weeks of age or younger) may breastfeed every 1 3 hours.  Newborns should not go longer than 3 hours during the day or 5 hours during the night without breastfeeding.  You should breastfeed your baby a minimum of 8 times in a 24-hour period until you begin to introduce solid foods to your baby at around 6 months of  age. BREAST MILK PUMPING Pumping and storing breast milk allows you to ensure that your baby is exclusively fed your breast milk, even at times when you are unable to breastfeed. This is especially important if you are going back to work while you are still breastfeeding or when you are not able to be present during feedings. Your lactation consultant can give you guidelines on how long it is safe to store breast milk.  A breast pump is a machine that allows you to pump milk from your breast into a sterile bottle. The pumped breast milk can then be stored in a refrigerator or freezer. Some breast pumps are operated by hand, while others use electricity. Ask your lactation consultant which type will work best for you. Breast pumps can be purchased, but some hospitals and breastfeeding support groups lease breast pumps on a monthly basis. A lactation consultant can teach you how to hand express breast milk, if you prefer not to use a pump.  CARING FOR YOUR BREASTS WHILE YOU BREASTFEED Nipples can become dry, cracked, and sore while breastfeeding. The following recommendations can help keep your breasts moisturized and healthy:  Avoid using soap on your nipples.   Wear a supportive bra. Although not required, special nursing bras and tank tops are designed to allow access to your breasts for breastfeeding without taking off your entire bra or top. Avoid wearing underwire style bras or extremely tight bras.  Air dry your nipples for 3 4minutes after each feeding.   Use only cotton bra pads to absorb leaked breast milk. Leaking of breast milk between feedings is normal.   Use lanolin on your nipples after breastfeeding. Lanolin helps to maintain your skin's normal moisture barrier. If you use pure lanolin you do not need to wash it off before feeding your baby again. Pure lanolin is not toxic to your baby. You may also hand express a few drops of breast milk and gently massage that milk into your  nipples and allow the milk to air dry. In the first few weeks after giving birth, some women experience extremely full breasts (engorgement). Engorgement can make your breasts feel heavy, warm, and tender to the touch. Engorgement peaks within 3 5 days after you give birth. The following recommendations can help ease engorgement:  Completely empty your breasts while breastfeeding or pumping. You may want to start by applying warm, moist heat (in the shower or with warm water-soaked hand towels) just before feeding or pumping. This increases circulation and helps the milk flow. If your baby does not completely empty your breasts while breastfeeding, pump any extra milk after he or she is finished.  Wear a snug bra (nursing or regular) or tank top for 1 2 days to signal your body to slightly decrease milk production.  Apply ice packs to your breasts, unless this is too uncomfortable for you.  Make sure that your baby is latched on and positioned properly while breastfeeding. If engorgement persists after 48 hours of following these recommendations, contact your health care provider or a lactation consultant. OVERALL   HEALTH CARE RECOMMENDATIONS WHILE BREASTFEEDING  Eat healthy foods. Alternate between meals and snacks, eating 3 of each per day. Because what you eat affects your breast milk, some of the foods may make your baby more irritable than usual. Avoid eating these foods if you are sure that they are negatively affecting your baby.  Drink milk, fruit juice, and water to satisfy your thirst (about 10 glasses a day).   Rest often, relax, and continue to take your prenatal vitamins to prevent fatigue, stress, and anemia.  Continue breast self-awareness checks.  Avoid chewing and smoking tobacco.  Avoid alcohol and drug use. Some medicines that may be harmful to your baby can pass through breast milk. It is important to ask your health care provider before taking any medicine, including all  over-the-counter and prescription medicine as well as vitamin and herbal supplements. It is possible to become pregnant while breastfeeding. If birth control is desired, ask your health care provider about options that will be safe for your baby. SEEK MEDICAL CARE IF:   You feel like you want to stop breastfeeding or have become frustrated with breastfeeding.  You have painful breasts or nipples.  Your nipples are cracked or bleeding.  Your breasts are red, tender, or warm.  You have a swollen area on either breast.  You have a fever or chills.  You have nausea or vomiting.  You have drainage other than breast milk from your nipples.  Your breasts do not become full before feedings by the 5th day after you give birth.  You feel sad and depressed.  Your baby is too sleepy to eat well.  Your baby is having trouble sleeping.   Your baby is wetting less than 3 diapers in a 24-hour period.  Your baby has less than 3 stools in a 24-hour period.  Your baby's skin or the white part of his or her eyes becomes yellow.   Your baby is not gaining weight by 5 days of age. SEEK IMMEDIATE MEDICAL CARE IF:   Your baby is overly tired (lethargic) and does not want to wake up and feed.  Your baby develops an unexplained fever. Document Released: 05/21/2005 Document Revised: 01/21/2013 Document Reviewed: 11/12/2012 [employer] Patient Information 2014 [employer].  

## 2013-08-31 NOTE — Progress Notes (Signed)
[id number]=[phone] 

## 2013-09-01 ENCOUNTER — Encounter: Payer: Self-pay | Admitting: Family Medicine

## 2013-09-01 LAB — GC/CHLAMYDIA PROBE AMP
CT Probe RNA: NEGATIVE
GC Probe RNA: NEGATIVE

## 2013-09-28 ENCOUNTER — Ambulatory Visit (INDEPENDENT_AMBULATORY_CARE_PROVIDER_SITE_OTHER): Payer: No Typology Code available for payment source | Admitting: Obstetrics & Gynecology

## 2013-09-28 ENCOUNTER — Encounter: Payer: Self-pay | Admitting: Obstetrics & Gynecology

## 2013-09-28 VITALS — BP 102/66 | HR 88 | Wt 128.6 lb

## 2013-09-28 DIAGNOSIS — Z3480 Encounter for supervision of other normal pregnancy, unspecified trimester: Secondary | ICD-10-CM

## 2013-09-28 NOTE — Progress Notes (Signed)
Routine visit. No problems. Discussed rec weight gain. She declines Quad screen. Will schedule anatomy scan at next visit.

## 2013-10-23 ENCOUNTER — Encounter: Payer: Self-pay | Admitting: Family Medicine

## 2013-10-23 ENCOUNTER — Ambulatory Visit (INDEPENDENT_AMBULATORY_CARE_PROVIDER_SITE_OTHER): Payer: No Typology Code available for payment source | Admitting: Family Medicine

## 2013-10-23 VITALS — BP 102/67 | HR 90 | Wt 129.0 lb

## 2013-10-23 DIAGNOSIS — Z3480 Encounter for supervision of other normal pregnancy, unspecified trimester: Secondary | ICD-10-CM

## 2013-10-23 NOTE — Progress Notes (Signed)
Schedule anatomy

## 2013-10-23 NOTE — Patient Instructions (Addendum)
Second Trimester of Pregnancy The second trimester is from week 13 through week 28, months 4 through 6. The second trimester is often a time when you feel your best. Your body has also adjusted to being pregnant, and you begin to feel better physically. Usually, morning sickness has lessened or quit completely, you may have more energy, and you may have an increase in appetite. The second trimester is also a time when the fetus is growing rapidly. At the end of the sixth month, the fetus is about 9 inches long and weighs about 1 pounds. You will likely begin to feel the baby move (quickening) between 18 and 20 weeks of the pregnancy. BODY CHANGES Your body goes through many changes during pregnancy. The changes vary from woman to woman.   Your weight will continue to increase. You will notice your lower abdomen bulging out.  You may begin to get stretch marks on your hips, abdomen, and breasts.  You may develop headaches that can be relieved by medicines approved by your caregiver.  You may urinate more often because the fetus is pressing on your bladder.  You may develop or continue to have heartburn as a result of your pregnancy.  You may develop constipation because certain hormones are causing the muscles that push waste through your intestines to slow down.  You may develop hemorrhoids or swollen, bulging veins (varicose veins).  You may have back pain because of the weight gain and pregnancy hormones relaxing your joints between the bones in your pelvis and as a result of a shift in weight and the muscles that support your balance.  Your breasts will continue to grow and be tender.  Your gums may bleed and may be sensitive to brushing and flossing.  Dark spots or blotches (chloasma, mask of pregnancy) may develop on your face. This will likely fade after the baby is born.  A dark line from your belly button to the pubic area (linea nigra) may appear. This will likely fade after  the baby is born. WHAT TO EXPECT AT YOUR PRENATAL VISITS During a routine prenatal visit:  You will be weighed to make sure you and the fetus are growing normally.  Your blood pressure will be taken.  Your abdomen will be measured to track your baby's growth.  The fetal heartbeat will be listened to.  Any test results from the previous visit will be discussed. Your caregiver may ask you:  How you are feeling.  If you are feeling the baby move.  If you have had any abnormal symptoms, such as leaking fluid, bleeding, severe headaches, or abdominal cramping.  If you have any questions. Other tests that may be performed during your second trimester include:  Blood tests that check for:  Low iron levels (anemia).  Gestational diabetes (between 24 and 28 weeks).  Rh antibodies.  Urine tests to check for infections, diabetes, or protein in the urine.  An ultrasound to confirm the proper growth and development of the baby.  An amniocentesis to check for possible genetic problems.  Fetal screens for spina bifida and Down syndrome. HOME CARE INSTRUCTIONS   Avoid all smoking, herbs, alcohol, and unprescribed drugs. These chemicals affect the formation and growth of the baby.  Follow your caregiver's instructions regarding medicine use. There are medicines that are either safe or unsafe to take during pregnancy.  Exercise only as directed by your caregiver. Experiencing uterine cramps is a good sign to stop exercising.  Continue to eat regular,  healthy meals.  Wear a good support bra for breast tenderness.  Do not use hot tubs, steam rooms, or saunas.  Wear your seat belt at all times when driving.  Avoid raw meat, uncooked cheese, cat litter boxes, and soil used by cats. These carry germs that can cause birth defects in the baby.  Take your prenatal vitamins.  Try taking a stool softener (if your caregiver approves) if you develop constipation. Eat more high-fiber  foods, such as fresh vegetables or fruit and whole grains. Drink plenty of fluids to keep your urine clear or pale yellow.  Take warm sitz baths to soothe any pain or discomfort caused by hemorrhoids. Use hemorrhoid cream if your caregiver approves.  If you develop varicose veins, wear support hose. Elevate your feet for 15 minutes, 3 4 times a day. Limit salt in your diet.  Avoid heavy lifting, wear low heel shoes, and practice good posture.  Rest with your legs elevated if you have leg cramps or low back pain.  Visit your dentist if you have not gone yet during your pregnancy. Use a soft toothbrush to brush your teeth and be gentle when you floss.  A sexual relationship may be continued unless your caregiver directs you otherwise.  Continue to go to all your prenatal visits as directed by your caregiver. SEEK MEDICAL CARE IF:   You have dizziness.  You have mild pelvic cramps, pelvic pressure, or nagging pain in the abdominal area.  You have persistent nausea, vomiting, or diarrhea.  You have a bad smelling vaginal discharge.  You have pain with urination. SEEK IMMEDIATE MEDICAL CARE IF:   You have a fever.  You are leaking fluid from your vagina.  You have spotting or bleeding from your vagina.  You have severe abdominal cramping or pain.  You have rapid weight gain or loss.  You have shortness of breath with chest pain.  You notice sudden or extreme swelling of your face, hands, ankles, feet, or legs.  You have not felt your baby move in over an hour.  You have severe headaches that do not go away with medicine.  You have vision changes. Document Released: 05/15/2001 Document Revised: 01/21/2013 Document Reviewed: 07/22/2012 [employer] Patient Information 2014 [employer].  Breastfeeding Deciding to breastfeed is one of the best choices you can make for you and your baby. A change in hormones during pregnancy causes your breast tissue to grow and increases the  number and size of your milk ducts. These hormones also allow proteins, sugars, and fats from your blood supply to make breast milk in your milk-producing glands. Hormones prevent breast milk from being released before your baby is born as well as prompt milk flow after birth. Once breastfeeding has begun, thoughts of your baby, as well as his or her sucking or crying, can stimulate the release of milk from your milk-producing glands.  BENEFITS OF BREASTFEEDING For Your Baby  Your first milk (colostrum) helps your baby's digestive system function better.   There are antibodies in your milk that help your baby fight off infections.   Your baby has a lower incidence of asthma, allergies, and sudden infant death syndrome.   The nutrients in breast milk are better for your baby than infant formulas and are designed uniquely for your baby's needs.   Breast milk improves your baby's brain development.   Your baby is less likely to develop other conditions, such as childhood obesity, asthma, or type 2 diabetes mellitus.  For   You   Breastfeeding helps to create a very special bond between you and your baby.   Breastfeeding is convenient. Breast milk is always available at the correct temperature and costs nothing.   Breastfeeding helps to burn calories and helps you lose the weight gained during pregnancy.   Breastfeeding makes your uterus contract to its prepregnancy size faster and slows bleeding (lochia) after you give birth.   Breastfeeding helps to lower your risk of developing type 2 diabetes mellitus, osteoporosis, and breast or ovarian cancer later in life. SIGNS THAT YOUR BABY IS HUNGRY Early Signs of Hunger  Increased alertness or activity.  Stretching.  Movement of the head from side to side.  Movement of the head and opening of the mouth when the corner of the mouth or cheek is stroked (rooting).  Increased sucking sounds, smacking lips, cooing, sighing, or  squeaking.  Hand-to-mouth movements.  Increased sucking of fingers or hands. Late Signs of Hunger  Fussing.  Intermittent crying. Extreme Signs of Hunger Signs of extreme hunger will require calming and consoling before your baby will be able to breastfeed successfully. Do not wait for the following signs of extreme hunger to occur before you initiate breastfeeding:   Restlessness.  A loud, strong cry.   Screaming. BREASTFEEDING BASICS Breastfeeding Initiation  Find a comfortable place to sit or lie down, with your neck and back well supported.  Place a pillow or rolled up blanket under your baby to bring him or her to the level of your breast (if you are seated). Nursing pillows are specially designed to help support your arms and your baby while you breastfeed.  Make sure that your baby's abdomen is facing your abdomen.   Gently massage your breast. With your fingertips, massage from your chest wall toward your nipple in a circular motion. This encourages milk flow. You may need to continue this action during the feeding if your milk flows slowly.  Support your breast with 4 fingers underneath and your thumb above your nipple. Make sure your fingers are well away from your nipple and your baby's mouth.   Stroke your baby's lips gently with your finger or nipple.   When your baby's mouth is open wide enough, quickly bring your baby to your breast, placing your entire nipple and as much of the colored area around your nipple (areola) as possible into your baby's mouth.   More areola should be visible above your baby's upper lip than below the lower lip.   Your baby's tongue should be between his or her lower gum and your breast.   Ensure that your baby's mouth is correctly positioned around your nipple (latched). Your baby's lips should create a seal on your breast and be turned out (everted).  It is common for your baby to suck about 2 3 minutes in order to start the  flow of breast milk. Latching Teaching your baby how to latch on to your breast properly is very important. An improper latch can cause nipple pain and decreased milk supply for you and poor weight gain in your baby. Also, if your baby is not latched onto your nipple properly, he or she may swallow some air during feeding. This can make your baby fussy. Burping your baby when you switch breasts during the feeding can help to get rid of the air. However, teaching your baby to latch on properly is still the best way to prevent fussiness from swallowing air while breastfeeding. Signs that your baby has   successfully latched on to your nipple:    Silent tugging or silent sucking, without causing you pain.   Swallowing heard between every 3 4 sucks.    Muscle movement above and in front of his or her ears while sucking.  Signs that your baby has not successfully latched on to nipple:   Sucking sounds or smacking sounds from your baby while breastfeeding.  Nipple pain. If you think your baby has not latched on correctly, slip your finger into the corner of your baby's mouth to break the suction and place it between your baby's gums. Attempt breastfeeding initiation again. Signs of Successful Breastfeeding Signs from your baby:   A gradual decrease in the number of sucks or complete cessation of sucking.   Falling asleep.   Relaxation of his or her body.   Retention of a small amount of milk in his or her mouth.   Letting go of your breast by himself or herself. Signs from you:  Breasts that have increased in firmness, weight, and size 1 3 hours after feeding.   Breasts that are softer immediately after breastfeeding.  Increased milk volume, as well as a change in milk consistency and color by the 5th day of breastfeeding.   Nipples that are not sore, cracked, or bleeding. Signs That Your Baby is Getting Enough Milk  Wetting at least 3 diapers in a 24-hour period. The urine  should be clear and pale yellow by age 5 days.  At least 3 stools in a 24-hour period by age 5 days. The stool should be soft and yellow.  At least 3 stools in a 24-hour period by age 7 days. The stool should be seedy and yellow.  No loss of weight greater than 10% of birth weight during the first 3 days of age.  Average weight gain of 4 7 ounces (120 210 mL) per week after age 4 days.  Consistent daily weight gain by age 5 days, without weight loss after the age of 2 weeks. After a feeding, your baby may spit up a small amount. This is common. BREASTFEEDING FREQUENCY AND DURATION Frequent feeding will help you make more milk and can prevent sore nipples and breast engorgement. Breastfeed when you feel the need to reduce the fullness of your breasts or when your baby shows signs of hunger. This is called "breastfeeding on demand." Avoid introducing a pacifier to your baby while you are working to establish breastfeeding (the first 4 6 weeks after your baby is born). After this time you may choose to use a pacifier. Research has shown that pacifier use during the first year of a baby's life decreases the risk of sudden infant death syndrome (SIDS). Allow your baby to feed on each breast as long as he or she wants. Breastfeed until your baby is finished feeding. When your baby unlatches or falls asleep while feeding from the first breast, offer the second breast. Because newborns are often sleepy in the first few weeks of life, you may need to awaken your baby to get him or her to feed. Breastfeeding times will vary from baby to baby. However, the following rules can serve as a guide to help you ensure that your baby is properly fed:  Newborns (babies 4 weeks of age or younger) may breastfeed every 1 3 hours.  Newborns should not go longer than 3 hours during the day or 5 hours during the night without breastfeeding.  You should breastfeed your baby a minimum of   8 times in a 24-hour period until  you begin to introduce solid foods to your baby at around 6 months of age. BREAST MILK PUMPING Pumping and storing breast milk allows you to ensure that your baby is exclusively fed your breast milk, even at times when you are unable to breastfeed. This is especially important if you are going back to work while you are still breastfeeding or when you are not able to be present during feedings. Your lactation consultant can give you guidelines on how long it is safe to store breast milk.  A breast pump is a machine that allows you to pump milk from your breast into a sterile bottle. The pumped breast milk can then be stored in a refrigerator or freezer. Some breast pumps are operated by hand, while others use electricity. Ask your lactation consultant which type will work best for you. Breast pumps can be purchased, but some hospitals and breastfeeding support groups lease breast pumps on a monthly basis. A lactation consultant can teach you how to hand express breast milk, if you prefer not to use a pump.  CARING FOR YOUR BREASTS WHILE YOU BREASTFEED Nipples can become dry, cracked, and sore while breastfeeding. The following recommendations can help keep your breasts moisturized and healthy:  Avoid using soap on your nipples.   Wear a supportive bra. Although not required, special nursing bras and tank tops are designed to allow access to your breasts for breastfeeding without taking off your entire bra or top. Avoid wearing underwire style bras or extremely tight bras.  Air dry your nipples for 3 4minutes after each feeding.   Use only cotton bra pads to absorb leaked breast milk. Leaking of breast milk between feedings is normal.   Use lanolin on your nipples after breastfeeding. Lanolin helps to maintain your skin's normal moisture barrier. If you use pure lanolin you do not need to wash it off before feeding your baby again. Pure lanolin is not toxic to your baby. You may also hand express a  few drops of breast milk and gently massage that milk into your nipples and allow the milk to air dry. In the first few weeks after giving birth, some women experience extremely full breasts (engorgement). Engorgement can make your breasts feel heavy, warm, and tender to the touch. Engorgement peaks within 3 5 days after you give birth. The following recommendations can help ease engorgement:  Completely empty your breasts while breastfeeding or pumping. You may want to start by applying warm, moist heat (in the shower or with warm water-soaked hand towels) just before feeding or pumping. This increases circulation and helps the milk flow. If your baby does not completely empty your breasts while breastfeeding, pump any extra milk after he or she is finished.  Wear a snug bra (nursing or regular) or tank top for 1 2 days to signal your body to slightly decrease milk production.  Apply ice packs to your breasts, unless this is too uncomfortable for you.  Make sure that your baby is latched on and positioned properly while breastfeeding. If engorgement persists after 48 hours of following these recommendations, contact your health care provider or a lactation consultant. OVERALL HEALTH CARE RECOMMENDATIONS WHILE BREASTFEEDING  Eat healthy foods. Alternate between meals and snacks, eating 3 of each per day. Because what you eat affects your breast milk, some of the foods may make your baby more irritable than usual. Avoid eating these foods if you are sure that they are   negatively affecting your baby.  Drink milk, fruit juice, and water to satisfy your thirst (about 10 glasses a day).   Rest often, relax, and continue to take your prenatal vitamins to prevent fatigue, stress, and anemia.  Continue breast self-awareness checks.  Avoid chewing and smoking tobacco.  Avoid alcohol and drug use. Some medicines that may be harmful to your baby can pass through breast milk. It is important to ask your  health care provider before taking any medicine, including all over-the-counter and prescription medicine as well as vitamin and herbal supplements. It is possible to become pregnant while breastfeeding. If birth control is desired, ask your health care provider about options that will be safe for your baby. SEEK MEDICAL CARE IF:   You feel like you want to stop breastfeeding or have become frustrated with breastfeeding.  You have painful breasts or nipples.  Your nipples are cracked or bleeding.  Your breasts are red, tender, or warm.  You have a swollen area on either breast.  You have a fever or chills.  You have nausea or vomiting.  You have drainage other than breast milk from your nipples.  Your breasts do not become full before feedings by the 5th day after you give birth.  You feel sad and depressed.  Your baby is too sleepy to eat well.  Your baby is having trouble sleeping.   Your baby is wetting less than 3 diapers in a 24-hour period.  Your baby has less than 3 stools in a 24-hour period.  Your baby's skin or the white part of his or her eyes becomes yellow.   Your baby is not gaining weight by 83 days of age. SEEK IMMEDIATE MEDICAL CARE IF:   Your baby is overly tired (lethargic) and does not want to wake up and feed.  Your baby develops an unexplained fever. Document Released: 05/21/2005 Document Revised: 01/21/2013 Document Reviewed: 11/12/2012 [facility] Patient Information 2014 [place], [place].  For colds and allergies Any anti-histamine including benadryl, allegra, claritin, etc.  Sudafed but not phenylephrine  Mucinex  Robitussin  For Reflux/heartburn Pepcid Zantac Tums Prilosec Prevacid  For yeast infections Monistat  For constipation Colace  For minor aches and pains Tylenol-do not take more than 4000mg  in 24 hours. Therma-care or like heat packs

## 2013-11-13 ENCOUNTER — Other Ambulatory Visit: Payer: Self-pay | Admitting: Family Medicine

## 2013-11-13 ENCOUNTER — Ambulatory Visit (HOSPITAL_COMMUNITY)
Admission: RE | Admit: 2013-11-13 | Discharge: 2013-11-13 | Disposition: A | Discharge status: Home / Self Care | Payer: No Typology Code available for payment source | Source: Ambulatory Visit | Attending: Family Medicine | Admitting: Family Medicine

## 2013-11-13 DIAGNOSIS — Z3480 Encounter for supervision of other normal pregnancy, unspecified trimester: Secondary | ICD-10-CM

## 2013-11-13 DIAGNOSIS — Z3689 Encounter for other specified antenatal screening: Secondary | ICD-10-CM | POA: Insufficient documentation

## 2013-11-15 ENCOUNTER — Encounter: Payer: Self-pay | Admitting: Family Medicine

## 2013-11-20 ENCOUNTER — Ambulatory Visit (INDEPENDENT_AMBULATORY_CARE_PROVIDER_SITE_OTHER): Payer: No Typology Code available for payment source | Admitting: Obstetrics & Gynecology

## 2013-11-20 VITALS — BP 94/63 | HR 83 | Wt 136.2 lb

## 2013-11-20 DIAGNOSIS — Z3482 Encounter for supervision of other normal pregnancy, second trimester: Secondary | ICD-10-CM

## 2013-11-20 DIAGNOSIS — Z3480 Encounter for supervision of other normal pregnancy, unspecified trimester: Secondary | ICD-10-CM

## 2013-11-20 NOTE — Progress Notes (Signed)
Incomplete anatomy scan, unable to visualize RVOT, cavum.  After extensive discussion, patient declines follow up scan for now.  No other complaints or concerns.  Routine obstetric precautions reviewed.

## 2013-11-20 NOTE — Patient Instructions (Signed)
Return to clinic for any obstetric concerns or go to [facility] for evaluation  

## 2013-12-18 ENCOUNTER — Ambulatory Visit (INDEPENDENT_AMBULATORY_CARE_PROVIDER_SITE_OTHER): Payer: No Typology Code available for payment source | Admitting: Family Medicine

## 2013-12-18 VITALS — BP 110/63 | HR 90 | Wt 139.0 lb

## 2013-12-18 DIAGNOSIS — Z2839 Other underimmunization status: Secondary | ICD-10-CM

## 2013-12-18 DIAGNOSIS — Z283 Underimmunization status: Secondary | ICD-10-CM

## 2013-12-18 DIAGNOSIS — Z3480 Encounter for supervision of other normal pregnancy, unspecified trimester: Secondary | ICD-10-CM

## 2013-12-18 DIAGNOSIS — O09899 Supervision of other high risk pregnancies, unspecified trimester: Secondary | ICD-10-CM

## 2013-12-18 DIAGNOSIS — Z3482 Encounter for supervision of other normal pregnancy, second trimester: Secondary | ICD-10-CM

## 2013-12-18 DIAGNOSIS — O9989 Other specified diseases and conditions complicating pregnancy, childbirth and the puerperium: Secondary | ICD-10-CM

## 2013-12-18 NOTE — Patient Instructions (Signed)
Second Trimester of Pregnancy The second trimester is from week 13 through week 28, months 4 through 6. The second trimester is often a time when you feel your best. Your body has also adjusted to being pregnant, and you begin to feel better physically. Usually, morning sickness has lessened or quit completely, you may have more energy, and you may have an increase in appetite. The second trimester is also a time when the fetus is growing rapidly. At the end of the sixth month, the fetus is about 9 inches long and weighs about 1 pounds. You will likely begin to feel the baby move (quickening) between 18 and 20 weeks of the pregnancy. BODY CHANGES Your body goes through many changes during pregnancy. The changes vary from woman to woman.   Your weight will continue to increase. You will notice your lower abdomen bulging out.  You may begin to get stretch marks on your hips, abdomen, and breasts.  You may develop headaches that can be relieved by medicines approved by your health care provider.  You may urinate more often because the fetus is pressing on your bladder.  You may develop or continue to have heartburn as a result of your pregnancy.  You may develop constipation because certain hormones are causing the muscles that push waste through your intestines to slow down.  You may develop hemorrhoids or swollen, bulging veins (varicose veins).  You may have back pain because of the weight gain and pregnancy hormones relaxing your joints between the bones in your pelvis and as a result of a shift in weight and the muscles that support your balance.  Your breasts will continue to grow and be tender.  Your gums may bleed and may be sensitive to brushing and flossing.  Dark spots or blotches (chloasma, mask of pregnancy) may develop on your face. This will likely fade after the baby is born.  A dark line from your belly button to the pubic area (linea nigra) may appear. This will likely  fade after the baby is born.  You may have changes in your hair. These can include thickening of your hair, rapid growth, and changes in texture. Some women also have hair loss during or after pregnancy, or hair that feels dry or thin. Your hair will most likely return to normal after your baby is born. WHAT TO EXPECT AT YOUR PRENATAL VISITS During a routine prenatal visit:  You will be weighed to make sure you and the fetus are growing normally.  Your blood pressure will be taken.  Your abdomen will be measured to track your baby's growth.  The fetal heartbeat will be listened to.  Any test results from the previous visit will be discussed. Your health care provider may ask you:  How you are feeling.  If you are feeling the baby move.  If you have had any abnormal symptoms, such as leaking fluid, bleeding, severe headaches, or abdominal cramping.  If you have any questions. Other tests that may be performed during your second trimester include:  Blood tests that check for:  Low iron levels (anemia).  Gestational diabetes (between 24 and 28 weeks).  Rh antibodies.  Urine tests to check for infections, diabetes, or protein in the urine.  An ultrasound to confirm the proper growth and development of the baby.  An amniocentesis to check for possible genetic problems.  Fetal screens for spina bifida and Down syndrome. HOME CARE INSTRUCTIONS   Avoid all smoking, herbs, alcohol, and unprescribed   drugs. These chemicals affect the formation and growth of the baby.  Follow your health care provider's instructions regarding medicine use. There are medicines that are either safe or unsafe to take during pregnancy.  Exercise only as directed by your health care provider. Experiencing uterine cramps is a good sign to stop exercising.  Continue to eat regular, healthy meals.  Wear a good support bra for breast tenderness.  Do not use hot tubs, steam rooms, or saunas.  Wear  your seat belt at all times when driving.  Avoid raw meat, uncooked cheese, cat litter boxes, and soil used by cats. These carry germs that can cause birth defects in the baby.  Take your prenatal vitamins.  Try taking a stool softener (if your health care provider approves) if you develop constipation. Eat more high-fiber foods, such as fresh vegetables or fruit and whole grains. Drink plenty of fluids to keep your urine clear or pale yellow.  Take warm sitz baths to soothe any pain or discomfort caused by hemorrhoids. Use hemorrhoid cream if your health care provider approves.  If you develop varicose veins, wear support hose. Elevate your feet for 15 minutes, 3-4 times a day. Limit salt in your diet.  Avoid heavy lifting, wear low heel shoes, and practice good posture.  Rest with your legs elevated if you have leg cramps or low back pain.  Visit your dentist if you have not gone yet during your pregnancy. Use a soft toothbrush to brush your teeth and be gentle when you floss.  A sexual relationship may be continued unless your health care provider directs you otherwise.  Continue to go to all your prenatal visits as directed by your health care provider. SEEK MEDICAL CARE IF:   You have dizziness.  You have mild pelvic cramps, pelvic pressure, or nagging pain in the abdominal area.  You have persistent nausea, vomiting, or diarrhea.  You have a bad smelling vaginal discharge.  You have pain with urination. SEEK IMMEDIATE MEDICAL CARE IF:   You have a fever.  You are leaking fluid from your vagina.  You have spotting or bleeding from your vagina.  You have severe abdominal cramping or pain.  You have rapid weight gain or loss.  You have shortness of breath with chest pain.  You notice sudden or extreme swelling of your face, hands, ankles, feet, or legs.  You have not felt your baby move in over an hour.  You have severe headaches that do not go away with  medicine.  You have vision changes. Document Released: 05/15/2001 Document Revised: 05/26/2013 Document Reviewed: 07/22/2012 [employer] Patient Information 2015 [employer]. This information is not intended to replace advice given to you by your health care provider. Make sure you discuss any questions you have with your health care provider.  Breastfeeding Deciding to breastfeed is one of the best choices you can make for you and your baby. A change in hormones during pregnancy causes your breast tissue to grow and increases the number and size of your milk ducts. These hormones also allow proteins, sugars, and fats from your blood supply to make breast milk in your milk-producing glands. Hormones prevent breast milk from being released before your baby is born as well as prompt milk flow after birth. Once breastfeeding has begun, thoughts of your baby, as well as his or her sucking or crying, can stimulate the release of milk from your milk-producing glands.  BENEFITS OF BREASTFEEDING For Your Baby  Your first   milk (colostrum) helps your baby's digestive system function better.   There are antibodies in your milk that help your baby fight off infections.   Your baby has a lower incidence of asthma, allergies, and sudden infant death syndrome.   The nutrients in breast milk are better for your baby than infant formulas and are designed uniquely for your baby's needs.   Breast milk improves your baby's brain development.   Your baby is less likely to develop other conditions, such as childhood obesity, asthma, or type 2 diabetes mellitus.  For You   Breastfeeding helps to create a very special bond between you and your baby.   Breastfeeding is convenient. Breast milk is always available at the correct temperature and costs nothing.   Breastfeeding helps to burn calories and helps you lose the weight gained during pregnancy.   Breastfeeding makes your uterus contract to its  prepregnancy size faster and slows bleeding (lochia) after you give birth.   Breastfeeding helps to lower your risk of developing type 2 diabetes mellitus, osteoporosis, and breast or ovarian cancer later in life. SIGNS THAT YOUR BABY IS HUNGRY Early Signs of Hunger  Increased alertness or activity.  Stretching.  Movement of the head from side to side.  Movement of the head and opening of the mouth when the corner of the mouth or cheek is stroked (rooting).  Increased sucking sounds, smacking lips, cooing, sighing, or squeaking.  Hand-to-mouth movements.  Increased sucking of fingers or hands. Late Signs of Hunger  Fussing.  Intermittent crying. Extreme Signs of Hunger Signs of extreme hunger will require calming and consoling before your baby will be able to breastfeed successfully. Do not wait for the following signs of extreme hunger to occur before you initiate breastfeeding:   Restlessness.  A loud, strong cry.   Screaming. BREASTFEEDING BASICS Breastfeeding Initiation  Find a comfortable place to sit or lie down, with your neck and back well supported.  Place a pillow or rolled up blanket under your baby to bring him or her to the level of your breast (if you are seated). Nursing pillows are specially designed to help support your arms and your baby while you breastfeed.  Make sure that your baby's abdomen is facing your abdomen.   Gently massage your breast. With your fingertips, massage from your chest wall toward your nipple in a circular motion. This encourages milk flow. You may need to continue this action during the feeding if your milk flows slowly.  Support your breast with 4 fingers underneath and your thumb above your nipple. Make sure your fingers are well away from your nipple and your baby's mouth.   Stroke your baby's lips gently with your finger or nipple.   When your baby's mouth is open wide enough, quickly bring your baby to your breast,  placing your entire nipple and as much of the colored area around your nipple (areola) as possible into your baby's mouth.   More areola should be visible above your baby's upper lip than below the lower lip.   Your baby's tongue should be between his or her lower gum and your breast.   Ensure that your baby's mouth is correctly positioned around your nipple (latched). Your baby's lips should create a seal on your breast and be turned out (everted).  It is common for your baby to suck about 2-3 minutes in order to start the flow of breast milk. Latching Teaching your baby how to latch on to your breast   properly is very important. An improper latch can cause nipple pain and decreased milk supply for you and poor weight gain in your baby. Also, if your baby is not latched onto your nipple properly, he or she may swallow some air during feeding. This can make your baby fussy. Burping your baby when you switch breasts during the feeding can help to get rid of the air. However, teaching your baby to latch on properly is still the best way to prevent fussiness from swallowing air while breastfeeding. Signs that your baby has successfully latched on to your nipple:    Silent tugging or silent sucking, without causing you pain.   Swallowing heard between every 3-4 sucks.    Muscle movement above and in front of his or her ears while sucking.  Signs that your baby has not successfully latched on to nipple:   Sucking sounds or smacking sounds from your baby while breastfeeding.  Nipple pain. If you think your baby has not latched on correctly, slip your finger into the corner of your baby's mouth to break the suction and place it between your baby's gums. Attempt breastfeeding initiation again. Signs of Successful Breastfeeding Signs from your baby:   A gradual decrease in the number of sucks or complete cessation of sucking.   Falling asleep.   Relaxation of his or her body.    Retention of a small amount of milk in his or her mouth.   Letting go of your breast by himself or herself. Signs from you:  Breasts that have increased in firmness, weight, and size 1-3 hours after feeding.   Breasts that are softer immediately after breastfeeding.  Increased milk volume, as well as a change in milk consistency and color by the fifth day of breastfeeding.   Nipples that are not sore, cracked, or bleeding. Signs That Your Baby is Getting Enough Milk  Wetting at least 3 diapers in a 24-hour period. The urine should be clear and pale yellow by age 5 days.  At least 3 stools in a 24-hour period by age 5 days. The stool should be soft and yellow.  At least 3 stools in a 24-hour period by age 7 days. The stool should be seedy and yellow.  No loss of weight greater than 10% of birth weight during the first 3 days of age.  Average weight gain of 4-7 ounces (113-198 g) per week after age 4 days.  Consistent daily weight gain by age 5 days, without weight loss after the age of 2 weeks. After a feeding, your baby may spit up a small amount. This is common. BREASTFEEDING FREQUENCY AND DURATION Frequent feeding will help you make more milk and can prevent sore nipples and breast engorgement. Breastfeed when you feel the need to reduce the fullness of your breasts or when your baby shows signs of hunger. This is called "breastfeeding on demand." Avoid introducing a pacifier to your baby while you are working to establish breastfeeding (the first 4-6 weeks after your baby is born). After this time you may choose to use a pacifier. Research has shown that pacifier use during the first year of a baby's life decreases the risk of sudden infant death syndrome (SIDS). Allow your baby to feed on each breast as long as he or she wants. Breastfeed until your baby is finished feeding. When your baby unlatches or falls asleep while feeding from the first breast, offer the second breast.  Because newborns are often sleepy in the   first few weeks of life, you may need to awaken your baby to get him or her to feed. Breastfeeding times will vary from baby to baby. However, the following rules can serve as a guide to help you ensure that your baby is properly fed:  Newborns (babies 4 weeks of age or younger) may breastfeed every 1-3 hours.  Newborns should not go longer than 3 hours during the day or 5 hours during the night without breastfeeding.  You should breastfeed your baby a minimum of 8 times in a 24-hour period until you begin to introduce solid foods to your baby at around 6 months of age. BREAST MILK PUMPING Pumping and storing breast milk allows you to ensure that your baby is exclusively fed your breast milk, even at times when you are unable to breastfeed. This is especially important if you are going back to work while you are still breastfeeding or when you are not able to be present during feedings. Your lactation consultant can give you guidelines on how long it is safe to store breast milk.  A breast pump is a machine that allows you to pump milk from your breast into a sterile bottle. The pumped breast milk can then be stored in a refrigerator or freezer. Some breast pumps are operated by hand, while others use electricity. Ask your lactation consultant which type will work best for you. Breast pumps can be purchased, but some hospitals and breastfeeding support groups lease breast pumps on a monthly basis. A lactation consultant can teach you how to hand express breast milk, if you prefer not to use a pump.  CARING FOR YOUR BREASTS WHILE YOU BREASTFEED Nipples can become dry, cracked, and sore while breastfeeding. The following recommendations can help keep your breasts moisturized and healthy:  Avoid using soap on your nipples.   Wear a supportive bra. Although not required, special nursing bras and tank tops are designed to allow access to your breasts for  breastfeeding without taking off your entire bra or top. Avoid wearing underwire-style bras or extremely tight bras.  Air dry your nipples for 3-4minutes after each feeding.   Use only cotton bra pads to absorb leaked breast milk. Leaking of breast milk between feedings is normal.   Use lanolin on your nipples after breastfeeding. Lanolin helps to maintain your skin's normal moisture barrier. If you use pure lanolin, you do not need to wash it off before feeding your baby again. Pure lanolin is not toxic to your baby. You may also hand express a few drops of breast milk and gently massage that milk into your nipples and allow the milk to air dry. In the first few weeks after giving birth, some women experience extremely full breasts (engorgement). Engorgement can make your breasts feel heavy, warm, and tender to the touch. Engorgement peaks within 3-5 days after you give birth. The following recommendations can help ease engorgement:  Completely empty your breasts while breastfeeding or pumping. You may want to start by applying warm, moist heat (in the shower or with warm water-soaked hand towels) just before feeding or pumping. This increases circulation and helps the milk flow. If your baby does not completely empty your breasts while breastfeeding, pump any extra milk after he or she is finished.  Wear a snug bra (nursing or regular) or tank top for 1-2 days to signal your body to slightly decrease milk production.  Apply ice packs to your breasts, unless this is too uncomfortable for you.    Make sure that your baby is latched on and positioned properly while breastfeeding. If engorgement persists after 48 hours of following these recommendations, contact your health care provider or a lactation consultant. OVERALL HEALTH CARE RECOMMENDATIONS WHILE BREASTFEEDING  Eat healthy foods. Alternate between meals and snacks, eating 3 of each per day. Because what you eat affects your breast milk,  some of the foods may make your baby more irritable than usual. Avoid eating these foods if you are sure that they are negatively affecting your baby.  Drink milk, fruit juice, and water to satisfy your thirst (about 10 glasses a day).   Rest often, relax, and continue to take your prenatal vitamins to prevent fatigue, stress, and anemia.  Continue breast self-awareness checks.  Avoid chewing and smoking tobacco.  Avoid alcohol and drug use. Some medicines that may be harmful to your baby can pass through breast milk. It is important to ask your health care provider before taking any medicine, including all over-the-counter and prescription medicine as well as vitamin and herbal supplements. It is possible to become pregnant while breastfeeding. If birth control is desired, ask your health care provider about options that will be safe for your baby. SEEK MEDICAL CARE IF:   You feel like you want to stop breastfeeding or have become frustrated with breastfeeding.  You have painful breasts or nipples.  Your nipples are cracked or bleeding.  Your breasts are red, tender, or warm.  You have a swollen area on either breast.  You have a fever or chills.  You have nausea or vomiting.  You have drainage other than breast milk from your nipples.  Your breasts do not become full before feedings by the fifth day after you give birth.  You feel sad and depressed.  Your baby is too sleepy to eat well.  Your baby is having trouble sleeping.   Your baby is wetting less than 3 diapers in a 24-hour period.  Your baby has less than 3 stools in a 24-hour period.  Your baby's skin or the white part of his or her eyes becomes yellow.   Your baby is not gaining weight by 5 days of age. SEEK IMMEDIATE MEDICAL CARE IF:   Your baby is overly tired (lethargic) and does not want to wake up and feed.  Your baby develops an unexplained fever. Document Released: 05/21/2005 Document Revised:  05/26/2013 Document Reviewed: 11/12/2012 [employer] Patient Information 2015 [employer]. This information is not intended to replace advice given to you by your health care provider. Make sure you discuss any questions you have with your health care provider.  

## 2013-12-18 NOTE — Progress Notes (Signed)
No LOF, no bleeding, no contractions Pressure is heavy feeling of the baby Excellent fetal movement. 28 wk labs and TDaP at next visit

## 2013-12-18 NOTE — Progress Notes (Signed)
Has noticed increase in pelvic pressure.

## 2014-01-18 ENCOUNTER — Encounter: Payer: Self-pay | Admitting: Family Medicine

## 2014-01-18 ENCOUNTER — Ambulatory Visit (INDEPENDENT_AMBULATORY_CARE_PROVIDER_SITE_OTHER): Payer: No Typology Code available for payment source | Admitting: Family Medicine

## 2014-01-18 VITALS — BP 107/69 | HR 93 | Wt 147.2 lb

## 2014-01-18 DIAGNOSIS — Z23 Encounter for immunization: Secondary | ICD-10-CM

## 2014-01-18 DIAGNOSIS — Z3480 Encounter for supervision of other normal pregnancy, unspecified trimester: Secondary | ICD-10-CM

## 2014-01-18 DIAGNOSIS — Z3483 Encounter for supervision of other normal pregnancy, third trimester: Secondary | ICD-10-CM

## 2014-01-18 LAB — CBC
HEMATOCRIT: 34.2 % — AB (ref 36.0–46.0)
HEMOGLOBIN: 11.9 g/dL — AB (ref 12.0–15.0)
MCH: 31.6 pg (ref 26.0–34.0)
MCHC: 34.8 g/dL (ref 30.0–36.0)
MCV: 91 fL (ref 78.0–100.0)
PLATELETS: 164 10*3/uL (ref 150–400)
RBC: 3.76 MIL/uL — AB (ref 3.87–5.11)
RDW: 14.1 % (ref 11.5–15.5)
WBC: 9.2 10*3/uL (ref 4.0–10.5)

## 2014-01-18 MED ORDER — TETANUS-DIPHTH-ACELL PERTUSSIS 5-2.5-18.5 LF-MCG/0.5 IM SUSP
0.5000 mL | Freq: Once | INTRAMUSCULAR | Status: AC
  Administered 2014-01-18: 0.5 mL via INTRAMUSCULAR

## 2014-01-18 NOTE — Patient Instructions (Signed)
Third Trimester of Pregnancy The third trimester is from week 29 through week 42, months 7 through 9. The third trimester is a time when the fetus is growing rapidly. At the end of the ninth month, the fetus is about 20 inches in length and weighs 6-10 pounds.  BODY CHANGES Your body goes through many changes during pregnancy. The changes vary from woman to woman.   Your weight will continue to increase. You can expect to gain 25-35 pounds (11-16 kg) by the end of the pregnancy.  You may begin to get stretch marks on your hips, abdomen, and breasts.  You may urinate more often because the fetus is moving lower into your pelvis and pressing on your bladder.  You may develop or continue to have heartburn as a result of your pregnancy.  You may develop constipation because certain hormones are causing the muscles that push waste through your intestines to slow down.  You may develop hemorrhoids or swollen, bulging veins (varicose veins).  You may have pelvic pain because of the weight gain and pregnancy hormones relaxing your joints between the bones in your pelvis. Backaches may result from overexertion of the muscles supporting your posture.  You may have changes in your hair. These can include thickening of your hair, rapid growth, and changes in texture. Some women also have hair loss during or after pregnancy, or hair that feels dry or thin. Your hair will most likely return to normal after your baby is born.  Your breasts will continue to grow and be tender. A yellow discharge may leak from your breasts called colostrum.  Your belly button may stick out.  You may feel short of breath because of your expanding uterus.  You may notice the fetus "dropping," or moving lower in your abdomen.  You may have a bloody mucus discharge. This usually occurs a few days to a week before labor begins.  Your cervix becomes thin and soft (effaced) near your due date. WHAT TO EXPECT AT YOUR  PRENATAL EXAMS  You will have prenatal exams every 2 weeks until week 36. Then, you will have weekly prenatal exams. During a routine prenatal visit:  You will be weighed to make sure you and the fetus are growing normally.  Your blood pressure is taken.  Your abdomen will be measured to track your baby's growth.  The fetal heartbeat will be listened to.  Any test results from the previous visit will be discussed.  You may have a cervical check near your due date to see if you have effaced. At around 36 weeks, your caregiver will check your cervix. At the same time, your caregiver will also perform a test on the secretions of the vaginal tissue. This test is to determine if a type of bacteria, Group B streptococcus, is present. Your caregiver will explain this further. Your caregiver may ask you:  What your birth plan is.  How you are feeling.  If you are feeling the baby move.  If you have had any abnormal symptoms, such as leaking fluid, bleeding, severe headaches, or abdominal cramping.  If you have any questions. Other tests or screenings that may be performed during your third trimester include:  Blood tests that check for low iron levels (anemia).  Fetal testing to check the health, activity level, and growth of the fetus. Testing is done if you have certain medical conditions or if there are problems during the pregnancy. FALSE LABOR You may feel small, irregular contractions that   eventually go away. These are called Braxton Hicks contractions, or false labor. Contractions may last for hours, days, or even weeks before true labor sets in. If contractions come at regular intervals, intensify, or become painful, it is best to be seen by your caregiver.  SIGNS OF LABOR   Menstrual-like cramps.  Contractions that are 5 minutes apart or less.  Contractions that start on the top of the uterus and spread down to the lower abdomen and back.  A sense of increased pelvic  pressure or back pain.  A watery or bloody mucus discharge that comes from the vagina. If you have any of these signs before the 37th week of pregnancy, call your caregiver right away. You need to go to the hospital to get checked immediately. HOME CARE INSTRUCTIONS   Avoid all smoking, herbs, alcohol, and unprescribed drugs. These chemicals affect the formation and growth of the baby.  Follow your caregiver's instructions regarding medicine use. There are medicines that are either safe or unsafe to take during pregnancy.  Exercise only as directed by your caregiver. Experiencing uterine cramps is a good sign to stop exercising.  Continue to eat regular, healthy meals.  Wear a good support bra for breast tenderness.  Do not use hot tubs, steam rooms, or saunas.  Wear your seat belt at all times when driving.  Avoid raw meat, uncooked cheese, cat litter boxes, and soil used by cats. These carry germs that can cause birth defects in the baby.  Take your prenatal vitamins.  Try taking a stool softener (if your caregiver approves) if you develop constipation. Eat more high-fiber foods, such as fresh vegetables or fruit and whole grains. Drink plenty of fluids to keep your urine clear or pale yellow.  Take warm sitz baths to soothe any pain or discomfort caused by hemorrhoids. Use hemorrhoid cream if your caregiver approves.  If you develop varicose veins, wear support hose. Elevate your feet for 15 minutes, 3-4 times a day. Limit salt in your diet.  Avoid heavy lifting, wear low heal shoes, and practice good posture.  Rest a lot with your legs elevated if you have leg cramps or low back pain.  Visit your dentist if you have not gone during your pregnancy. Use a soft toothbrush to brush your teeth and be gentle when you floss.  A sexual relationship may be continued unless your caregiver directs you otherwise.  Do not travel far distances unless it is absolutely necessary and only  with the approval of your caregiver.  Take prenatal classes to understand, practice, and ask questions about the labor and delivery.  Make a trial run to the hospital.  Pack your hospital bag.  Prepare the baby's nursery.  Continue to go to all your prenatal visits as directed by your caregiver. SEEK MEDICAL CARE IF:  You are unsure if you are in labor or if your water has broken.  You have dizziness.  You have mild pelvic cramps, pelvic pressure, or nagging pain in your abdominal area.  You have persistent nausea, vomiting, or diarrhea.  You have a bad smelling vaginal discharge.  You have pain with urination. SEEK IMMEDIATE MEDICAL CARE IF:   You have a fever.  You are leaking fluid from your vagina.  You have spotting or bleeding from your vagina.  You have severe abdominal cramping or pain.  You have rapid weight loss or gain.  You have shortness of breath with chest pain.  You notice sudden or extreme swelling   of your face, hands, ankles, feet, or legs.  You have not felt your baby move in over an hour.  You have severe headaches that do not go away with medicine.  You have vision changes. Document Released: 05/15/2001 Document Revised: 05/26/2013 Document Reviewed: 07/22/2012 [employer] Patient Information 2015 [employer]. This information is not intended to replace advice given to you by your health care provider. Make sure you discuss any questions you have with your health care provider.  Breastfeeding Deciding to breastfeed is one of the best choices you can make for you and your baby. A change in hormones during pregnancy causes your breast tissue to grow and increases the number and size of your milk ducts. These hormones also allow proteins, sugars, and fats from your blood supply to make breast milk in your milk-producing glands. Hormones prevent breast milk from being released before your baby is born as well as prompt milk flow after birth. Once  breastfeeding has begun, thoughts of your baby, as well as his or her sucking or crying, can stimulate the release of milk from your milk-producing glands.  BENEFITS OF BREASTFEEDING For Your Baby  Your first milk (colostrum) helps your baby's digestive system function better.   There are antibodies in your milk that help your baby fight off infections.   Your baby has a lower incidence of asthma, allergies, and sudden infant death syndrome.   The nutrients in breast milk are better for your baby than infant formulas and are designed uniquely for your baby's needs.   Breast milk improves your baby's brain development.   Your baby is less likely to develop other conditions, such as childhood obesity, asthma, or type 2 diabetes mellitus.  For You   Breastfeeding helps to create a very special bond between you and your baby.   Breastfeeding is convenient. Breast milk is always available at the correct temperature and costs nothing.   Breastfeeding helps to burn calories and helps you lose the weight gained during pregnancy.   Breastfeeding makes your uterus contract to its prepregnancy size faster and slows bleeding (lochia) after you give birth.   Breastfeeding helps to lower your risk of developing type 2 diabetes mellitus, osteoporosis, and breast or ovarian cancer later in life. SIGNS THAT YOUR BABY IS HUNGRY Early Signs of Hunger  Increased alertness or activity.  Stretching.  Movement of the head from side to side.  Movement of the head and opening of the mouth when the corner of the mouth or cheek is stroked (rooting).  Increased sucking sounds, smacking lips, cooing, sighing, or squeaking.  Hand-to-mouth movements.  Increased sucking of fingers or hands. Late Signs of Hunger  Fussing.  Intermittent crying. Extreme Signs of Hunger Signs of extreme hunger will require calming and consoling before your baby will be able to breastfeed successfully. Do not  wait for the following signs of extreme hunger to occur before you initiate breastfeeding:   Restlessness.  A loud, strong cry.   Screaming. BREASTFEEDING BASICS Breastfeeding Initiation  Find a comfortable place to sit or lie down, with your neck and back well supported.  Place a pillow or rolled up blanket under your baby to bring him or her to the level of your breast (if you are seated). Nursing pillows are specially designed to help support your arms and your baby while you breastfeed.  Make sure that your baby's abdomen is facing your abdomen.   Gently massage your breast. With your fingertips, massage from your chest   wall toward your nipple in a circular motion. This encourages milk flow. You may need to continue this action during the feeding if your milk flows slowly.  Support your breast with 4 fingers underneath and your thumb above your nipple. Make sure your fingers are well away from your nipple and your baby's mouth.   Stroke your baby's lips gently with your finger or nipple.   When your baby's mouth is open wide enough, quickly bring your baby to your breast, placing your entire nipple and as much of the colored area around your nipple (areola) as possible into your baby's mouth.   More areola should be visible above your baby's upper lip than below the lower lip.   Your baby's tongue should be between his or her lower gum and your breast.   Ensure that your baby's mouth is correctly positioned around your nipple (latched). Your baby's lips should create a seal on your breast and be turned out (everted).  It is common for your baby to suck about 2-3 minutes in order to start the flow of breast milk. Latching Teaching your baby how to latch on to your breast properly is very important. An improper latch can cause nipple pain and decreased milk supply for you and poor weight gain in your baby. Also, if your baby is not latched onto your nipple properly, he or she  may swallow some air during feeding. This can make your baby fussy. Burping your baby when you switch breasts during the feeding can help to get rid of the air. However, teaching your baby to latch on properly is still the best way to prevent fussiness from swallowing air while breastfeeding. Signs that your baby has successfully latched on to your nipple:    Silent tugging or silent sucking, without causing you pain.   Swallowing heard between every 3-4 sucks.    Muscle movement above and in front of his or her ears while sucking.  Signs that your baby has not successfully latched on to nipple:   Sucking sounds or smacking sounds from your baby while breastfeeding.  Nipple pain. If you think your baby has not latched on correctly, slip your finger into the corner of your baby's mouth to break the suction and place it between your baby's gums. Attempt breastfeeding initiation again. Signs of Successful Breastfeeding Signs from your baby:   A gradual decrease in the number of sucks or complete cessation of sucking.   Falling asleep.   Relaxation of his or her body.   Retention of a small amount of milk in his or her mouth.   Letting go of your breast by himself or herself. Signs from you:  Breasts that have increased in firmness, weight, and size 1-3 hours after feeding.   Breasts that are softer immediately after breastfeeding.  Increased milk volume, as well as a change in milk consistency and color by the fifth day of breastfeeding.   Nipples that are not sore, cracked, or bleeding. Signs That Your Baby is Getting Enough Milk  Wetting at least 3 diapers in a 24-hour period. The urine should be clear and pale yellow by age 5 days.  At least 3 stools in a 24-hour period by age 5 days. The stool should be soft and yellow.  At least 3 stools in a 24-hour period by age 7 days. The stool should be seedy and yellow.  No loss of weight greater than 10% of birth weight  during the first 3   days of age.  Average weight gain of 4-7 ounces (113-198 g) per week after age 4 days.  Consistent daily weight gain by age 5 days, without weight loss after the age of 2 weeks. After a feeding, your baby may spit up a small amount. This is common. BREASTFEEDING FREQUENCY AND DURATION Frequent feeding will help you make more milk and can prevent sore nipples and breast engorgement. Breastfeed when you feel the need to reduce the fullness of your breasts or when your baby shows signs of hunger. This is called "breastfeeding on demand." Avoid introducing a pacifier to your baby while you are working to establish breastfeeding (the first 4-6 weeks after your baby is born). After this time you may choose to use a pacifier. Research has shown that pacifier use during the first year of a baby's life decreases the risk of sudden infant death syndrome (SIDS). Allow your baby to feed on each breast as long as he or she wants. Breastfeed until your baby is finished feeding. When your baby unlatches or falls asleep while feeding from the first breast, offer the second breast. Because newborns are often sleepy in the first few weeks of life, you may need to awaken your baby to get him or her to feed. Breastfeeding times will vary from baby to baby. However, the following rules can serve as a guide to help you ensure that your baby is properly fed:  Newborns (babies 4 weeks of age or younger) may breastfeed every 1-3 hours.  Newborns should not go longer than 3 hours during the day or 5 hours during the night without breastfeeding.  You should breastfeed your baby a minimum of 8 times in a 24-hour period until you begin to introduce solid foods to your baby at around 6 months of age. BREAST MILK PUMPING Pumping and storing breast milk allows you to ensure that your baby is exclusively fed your breast milk, even at times when you are unable to breastfeed. This is especially important if you are  going back to work while you are still breastfeeding or when you are not able to be present during feedings. Your lactation consultant can give you guidelines on how long it is safe to store breast milk.  A breast pump is a machine that allows you to pump milk from your breast into a sterile bottle. The pumped breast milk can then be stored in a refrigerator or freezer. Some breast pumps are operated by hand, while others use electricity. Ask your lactation consultant which type will work best for you. Breast pumps can be purchased, but some hospitals and breastfeeding support groups lease breast pumps on a monthly basis. A lactation consultant can teach you how to hand express breast milk, if you prefer not to use a pump.  CARING FOR YOUR BREASTS WHILE YOU BREASTFEED Nipples can become dry, cracked, and sore while breastfeeding. The following recommendations can help keep your breasts moisturized and healthy:  Avoid using soap on your nipples.   Wear a supportive bra. Although not required, special nursing bras and tank tops are designed to allow access to your breasts for breastfeeding without taking off your entire bra or top. Avoid wearing underwire-style bras or extremely tight bras.  Air dry your nipples for 3-4minutes after each feeding.   Use only cotton bra pads to absorb leaked breast milk. Leaking of breast milk between feedings is normal.   Use lanolin on your nipples after breastfeeding. Lanolin helps to maintain your skin's   normal moisture barrier. If you use pure lanolin, you do not need to wash it off before feeding your baby again. Pure lanolin is not toxic to your baby. You may also hand express a few drops of breast milk and gently massage that milk into your nipples and allow the milk to air dry. In the first few weeks after giving birth, some women experience extremely full breasts (engorgement). Engorgement can make your breasts feel heavy, warm, and tender to the touch.  Engorgement peaks within 3-5 days after you give birth. The following recommendations can help ease engorgement:  Completely empty your breasts while breastfeeding or pumping. You may want to start by applying warm, moist heat (in the shower or with warm water-soaked hand towels) just before feeding or pumping. This increases circulation and helps the milk flow. If your baby does not completely empty your breasts while breastfeeding, pump any extra milk after he or she is finished.  Wear a snug bra (nursing or regular) or tank top for 1-2 days to signal your body to slightly decrease milk production.  Apply ice packs to your breasts, unless this is too uncomfortable for you.  Make sure that your baby is latched on and positioned properly while breastfeeding. If engorgement persists after 48 hours of following these recommendations, contact your health care provider or a lactation consultant. OVERALL HEALTH CARE RECOMMENDATIONS WHILE BREASTFEEDING  Eat healthy foods. Alternate between meals and snacks, eating 3 of each per day. Because what you eat affects your breast milk, some of the foods may make your baby more irritable than usual. Avoid eating these foods if you are sure that they are negatively affecting your baby.  Drink milk, fruit juice, and water to satisfy your thirst (about 10 glasses a day).   Rest often, relax, and continue to take your prenatal vitamins to prevent fatigue, stress, and anemia.  Continue breast self-awareness checks.  Avoid chewing and smoking tobacco.  Avoid alcohol and drug use. Some medicines that may be harmful to your baby can pass through breast milk. It is important to ask your health care provider before taking any medicine, including all over-the-counter and prescription medicine as well as vitamin and herbal supplements. It is possible to become pregnant while breastfeeding. If birth control is desired, ask your health care provider about options that  will be safe for your baby. SEEK MEDICAL CARE IF:   You feel like you want to stop breastfeeding or have become frustrated with breastfeeding.  You have painful breasts or nipples.  Your nipples are cracked or bleeding.  Your breasts are red, tender, or warm.  You have a swollen area on either breast.  You have a fever or chills.  You have nausea or vomiting.  You have drainage other than breast milk from your nipples.  Your breasts do not become full before feedings by the fifth day after you give birth.  You feel sad and depressed.  Your baby is too sleepy to eat well.  Your baby is having trouble sleeping.   Your baby is wetting less than 3 diapers in a 24-hour period.  Your baby has less than 3 stools in a 24-hour period.  Your baby's skin or the white part of his or her eyes becomes yellow.   Your baby is not gaining weight by 5 days of age. SEEK IMMEDIATE MEDICAL CARE IF:   Your baby is overly tired (lethargic) and does not want to wake up and feed.  Your baby   develops an unexplained fever. Document Released: 05/21/2005 Document Revised: 05/26/2013 Document Reviewed: 11/12/2012 [employer] Patient Information 2015 [employer]. This information is not intended to replace advice given to you by your health care provider. Make sure you discuss any questions you have with your health care provider.  

## 2014-01-18 NOTE — Progress Notes (Signed)
28 wk labs and TDaP today S=D No concerns

## 2014-01-19 ENCOUNTER — Encounter: Payer: Self-pay | Admitting: Family Medicine

## 2014-01-19 LAB — HIV ANTIBODY (ROUTINE TESTING W REFLEX): HIV: NONREACTIVE

## 2014-01-19 LAB — RPR

## 2014-01-19 LAB — GLUCOSE TOLERANCE, 1 HOUR (50G) W/O FASTING: GLUCOSE 1 HOUR GTT: 98 mg/dL (ref 70–140)

## 2014-02-01 ENCOUNTER — Ambulatory Visit (INDEPENDENT_AMBULATORY_CARE_PROVIDER_SITE_OTHER): Payer: No Typology Code available for payment source | Admitting: Obstetrics & Gynecology

## 2014-02-01 VITALS — BP 106/62 | HR 97 | Wt 149.0 lb

## 2014-02-01 DIAGNOSIS — Z3483 Encounter for supervision of other normal pregnancy, third trimester: Secondary | ICD-10-CM

## 2014-02-01 DIAGNOSIS — Z3480 Encounter for supervision of other normal pregnancy, unspecified trimester: Secondary | ICD-10-CM

## 2014-02-01 NOTE — Progress Notes (Signed)
1 hr GTT 98, normal labs.  No other complaints or concerns.  Labor and fetal movement precautions reviewed.

## 2014-02-01 NOTE — Patient Instructions (Signed)
Return to clinic for any obstetric concerns or go to [facility] for evaluation  

## 2014-02-15 ENCOUNTER — Ambulatory Visit (INDEPENDENT_AMBULATORY_CARE_PROVIDER_SITE_OTHER): Payer: No Typology Code available for payment source | Admitting: Obstetrics and Gynecology

## 2014-02-15 ENCOUNTER — Encounter: Payer: Self-pay | Admitting: Obstetrics and Gynecology

## 2014-02-15 VITALS — BP 98/70 | HR 91 | Wt 150.0 lb

## 2014-02-15 DIAGNOSIS — Z283 Underimmunization status: Secondary | ICD-10-CM

## 2014-02-15 DIAGNOSIS — O9989 Other specified diseases and conditions complicating pregnancy, childbirth and the puerperium: Secondary | ICD-10-CM

## 2014-02-15 DIAGNOSIS — Z3480 Encounter for supervision of other normal pregnancy, unspecified trimester: Secondary | ICD-10-CM

## 2014-02-15 DIAGNOSIS — Z3483 Encounter for supervision of other normal pregnancy, third trimester: Secondary | ICD-10-CM

## 2014-02-15 DIAGNOSIS — Z2839 Other underimmunization status: Secondary | ICD-10-CM

## 2014-02-15 DIAGNOSIS — O09899 Supervision of other high risk pregnancies, unspecified trimester: Secondary | ICD-10-CM

## 2014-02-15 NOTE — Progress Notes (Signed)
Patient is doing well without complaints. FM/PTL precautions reviewed.  

## 2014-03-01 ENCOUNTER — Ambulatory Visit (INDEPENDENT_AMBULATORY_CARE_PROVIDER_SITE_OTHER): Payer: No Typology Code available for payment source | Admitting: Obstetrics & Gynecology

## 2014-03-01 ENCOUNTER — Encounter: Payer: Self-pay | Admitting: Obstetrics & Gynecology

## 2014-03-01 VITALS — BP 98/67 | HR 90 | Wt 152.6 lb

## 2014-03-01 DIAGNOSIS — Z3483 Encounter for supervision of other normal pregnancy, third trimester: Secondary | ICD-10-CM

## 2014-03-01 DIAGNOSIS — Z3480 Encounter for supervision of other normal pregnancy, unspecified trimester: Secondary | ICD-10-CM

## 2014-03-01 NOTE — Patient Instructions (Signed)
Return to clinic for any obstetric concerns or go to [facility] for evaluation  

## 2014-03-01 NOTE — Progress Notes (Signed)
Pelvic cultures next visit. No other complaints or concerns.  Labor and fetal movement precautions reviewed. 

## 2014-03-08 ENCOUNTER — Encounter: Payer: Self-pay | Admitting: Family Medicine

## 2014-03-08 ENCOUNTER — Ambulatory Visit (INDEPENDENT_AMBULATORY_CARE_PROVIDER_SITE_OTHER): Payer: No Typology Code available for payment source | Admitting: Family Medicine

## 2014-03-08 VITALS — BP 120/73 | HR 82 | Wt 154.0 lb

## 2014-03-08 DIAGNOSIS — Z3483 Encounter for supervision of other normal pregnancy, third trimester: Secondary | ICD-10-CM

## 2014-03-08 DIAGNOSIS — Z36 Encounter for antenatal screening of mother: Secondary | ICD-10-CM

## 2014-03-08 LAB — OB RESULTS CONSOLE GC/CHLAMYDIA
Chlamydia: NEGATIVE
Gonorrhea: NEGATIVE

## 2014-03-08 LAB — OB RESULTS CONSOLE GBS: GBS: NEGATIVE

## 2014-03-08 NOTE — Progress Notes (Signed)
Doing well Cultures today 

## 2014-03-08 NOTE — Patient Instructions (Signed)
Second Trimester of Pregnancy The second trimester is from week 13 through week 28, months 4 through 6. The second trimester is often a time when you feel your best. Your body has also adjusted to being pregnant, and you begin to feel better physically. Usually, morning sickness has lessened or quit completely, you may have more energy, and you may have an increase in appetite. The second trimester is also a time when the fetus is growing rapidly. At the end of the sixth month, the fetus is about 9 inches long and weighs about 1 pounds. You will likely begin to feel the baby move (quickening) between 18 and 20 weeks of the pregnancy. BODY CHANGES Your body goes through many changes during pregnancy. The changes vary from woman to woman.   Your weight will continue to increase. You will notice your lower abdomen bulging out.  You may begin to get stretch marks on your hips, abdomen, and breasts.  You may develop headaches that can be relieved by medicines approved by your health care provider.  You may urinate more often because the fetus is pressing on your bladder.  You may develop or continue to have heartburn as a result of your pregnancy.  You may develop constipation because certain hormones are causing the muscles that push waste through your intestines to slow down.  You may develop hemorrhoids or swollen, bulging veins (varicose veins).  You may have back pain because of the weight gain and pregnancy hormones relaxing your joints between the bones in your pelvis and as a result of a shift in weight and the muscles that support your balance.  Your breasts will continue to grow and be tender.  Your gums may bleed and may be sensitive to brushing and flossing.  Dark spots or blotches (chloasma, mask of pregnancy) may develop on your face. This will likely fade after the baby is born.  A dark line from your belly button to the pubic area (linea nigra) may appear. This will likely  fade after the baby is born.  You may have changes in your hair. These can include thickening of your hair, rapid growth, and changes in texture. Some women also have hair loss during or after pregnancy, or hair that feels dry or thin. Your hair will most likely return to normal after your baby is born. WHAT TO EXPECT AT YOUR PRENATAL VISITS During a routine prenatal visit:  You will be weighed to make sure you and the fetus are growing normally.  Your blood pressure will be taken.  Your abdomen will be measured to track your baby's growth.  The fetal heartbeat will be listened to.  Any test results from the previous visit will be discussed. Your health care provider may ask you:  How you are feeling.  If you are feeling the baby move.  If you have had any abnormal symptoms, such as leaking fluid, bleeding, severe headaches, or abdominal cramping.  If you have any questions. Other tests that may be performed during your second trimester include:  Blood tests that check for:  Low iron levels (anemia).  Gestational diabetes (between 24 and 28 weeks).  Rh antibodies.  Urine tests to check for infections, diabetes, or protein in the urine.  An ultrasound to confirm the proper growth and development of the baby.  An amniocentesis to check for possible genetic problems.  Fetal screens for spina bifida and Down syndrome. HOME CARE INSTRUCTIONS   Avoid all smoking, herbs, alcohol, and unprescribed   drugs. These chemicals affect the formation and growth of the baby.  Follow your health care provider's instructions regarding medicine use. There are medicines that are either safe or unsafe to take during pregnancy.  Exercise only as directed by your health care provider. Experiencing uterine cramps is a good sign to stop exercising.  Continue to eat regular, healthy meals.  Wear a good support bra for breast tenderness.  Do not use hot tubs, steam rooms, or saunas.  Wear  your seat belt at all times when driving.  Avoid raw meat, uncooked cheese, cat litter boxes, and soil used by cats. These carry germs that can cause birth defects in the baby.  Take your prenatal vitamins.  Try taking a stool softener (if your health care provider approves) if you develop constipation. Eat more high-fiber foods, such as fresh vegetables or fruit and whole grains. Drink plenty of fluids to keep your urine clear or pale yellow.  Take warm sitz baths to soothe any pain or discomfort caused by hemorrhoids. Use hemorrhoid cream if your health care provider approves.  If you develop varicose veins, wear support hose. Elevate your feet for 15 minutes, 3-4 times a day. Limit salt in your diet.  Avoid heavy lifting, wear low heel shoes, and practice good posture.  Rest with your legs elevated if you have leg cramps or low back pain.  Visit your dentist if you have not gone yet during your pregnancy. Use a soft toothbrush to brush your teeth and be gentle when you floss.  A sexual relationship may be continued unless your health care provider directs you otherwise.  Continue to go to all your prenatal visits as directed by your health care provider. SEEK MEDICAL CARE IF:   You have dizziness.  You have mild pelvic cramps, pelvic pressure, or nagging pain in the abdominal area.  You have persistent nausea, vomiting, or diarrhea.  You have a bad smelling vaginal discharge.  You have pain with urination. SEEK IMMEDIATE MEDICAL CARE IF:   You have a fever.  You are leaking fluid from your vagina.  You have spotting or bleeding from your vagina.  You have severe abdominal cramping or pain.  You have rapid weight gain or loss.  You have shortness of breath with chest pain.  You notice sudden or extreme swelling of your face, hands, ankles, feet, or legs.  You have not felt your baby move in over an hour.  You have severe headaches that do not go away with  medicine.  You have vision changes. Document Released: 05/15/2001 Document Revised: 05/26/2013 Document Reviewed: 07/22/2012 [employer] Patient Information 2015 [employer]. This information is not intended to replace advice given to you by your health care provider. Make sure you discuss any questions you have with your health care provider.  Breastfeeding Deciding to breastfeed is one of the best choices you can make for you and your baby. A change in hormones during pregnancy causes your breast tissue to grow and increases the number and size of your milk ducts. These hormones also allow proteins, sugars, and fats from your blood supply to make breast milk in your milk-producing glands. Hormones prevent breast milk from being released before your baby is born as well as prompt milk flow after birth. Once breastfeeding has begun, thoughts of your baby, as well as his or her sucking or crying, can stimulate the release of milk from your milk-producing glands.  BENEFITS OF BREASTFEEDING For Your Baby  Your first   milk (colostrum) helps your baby's digestive system function better.   There are antibodies in your milk that help your baby fight off infections.   Your baby has a lower incidence of asthma, allergies, and sudden infant death syndrome.   The nutrients in breast milk are better for your baby than infant formulas and are designed uniquely for your baby's needs.   Breast milk improves your baby's brain development.   Your baby is less likely to develop other conditions, such as childhood obesity, asthma, or type 2 diabetes mellitus.  For You   Breastfeeding helps to create a very special bond between you and your baby.   Breastfeeding is convenient. Breast milk is always available at the correct temperature and costs nothing.   Breastfeeding helps to burn calories and helps you lose the weight gained during pregnancy.   Breastfeeding makes your uterus contract to its  prepregnancy size faster and slows bleeding (lochia) after you give birth.   Breastfeeding helps to lower your risk of developing type 2 diabetes mellitus, osteoporosis, and breast or ovarian cancer later in life. SIGNS THAT YOUR BABY IS HUNGRY Early Signs of Hunger  Increased alertness or activity.  Stretching.  Movement of the head from side to side.  Movement of the head and opening of the mouth when the corner of the mouth or cheek is stroked (rooting).  Increased sucking sounds, smacking lips, cooing, sighing, or squeaking.  Hand-to-mouth movements.  Increased sucking of fingers or hands. Late Signs of Hunger  Fussing.  Intermittent crying. Extreme Signs of Hunger Signs of extreme hunger will require calming and consoling before your baby will be able to breastfeed successfully. Do not wait for the following signs of extreme hunger to occur before you initiate breastfeeding:   Restlessness.  A loud, strong cry.   Screaming. BREASTFEEDING BASICS Breastfeeding Initiation  Find a comfortable place to sit or lie down, with your neck and back well supported.  Place a pillow or rolled up blanket under your baby to bring him or her to the level of your breast (if you are seated). Nursing pillows are specially designed to help support your arms and your baby while you breastfeed.  Make sure that your baby's abdomen is facing your abdomen.   Gently massage your breast. With your fingertips, massage from your chest wall toward your nipple in a circular motion. This encourages milk flow. You may need to continue this action during the feeding if your milk flows slowly.  Support your breast with 4 fingers underneath and your thumb above your nipple. Make sure your fingers are well away from your nipple and your baby's mouth.   Stroke your baby's lips gently with your finger or nipple.   When your baby's mouth is open wide enough, quickly bring your baby to your breast,  placing your entire nipple and as much of the colored area around your nipple (areola) as possible into your baby's mouth.   More areola should be visible above your baby's upper lip than below the lower lip.   Your baby's tongue should be between his or her lower gum and your breast.   Ensure that your baby's mouth is correctly positioned around your nipple (latched). Your baby's lips should create a seal on your breast and be turned out (everted).  It is common for your baby to suck about 2-3 minutes in order to start the flow of breast milk. Latching Teaching your baby how to latch on to your breast   properly is very important. An improper latch can cause nipple pain and decreased milk supply for you and poor weight gain in your baby. Also, if your baby is not latched onto your nipple properly, he or she may swallow some air during feeding. This can make your baby fussy. Burping your baby when you switch breasts during the feeding can help to get rid of the air. However, teaching your baby to latch on properly is still the best way to prevent fussiness from swallowing air while breastfeeding. Signs that your baby has successfully latched on to your nipple:    Silent tugging or silent sucking, without causing you pain.   Swallowing heard between every 3-4 sucks.    Muscle movement above and in front of his or her ears while sucking.  Signs that your baby has not successfully latched on to nipple:   Sucking sounds or smacking sounds from your baby while breastfeeding.  Nipple pain. If you think your baby has not latched on correctly, slip your finger into the corner of your baby's mouth to break the suction and place it between your baby's gums. Attempt breastfeeding initiation again. Signs of Successful Breastfeeding Signs from your baby:   A gradual decrease in the number of sucks or complete cessation of sucking.   Falling asleep.   Relaxation of his or her body.    Retention of a small amount of milk in his or her mouth.   Letting go of your breast by himself or herself. Signs from you:  Breasts that have increased in firmness, weight, and size 1-3 hours after feeding.   Breasts that are softer immediately after breastfeeding.  Increased milk volume, as well as a change in milk consistency and color by the fifth day of breastfeeding.   Nipples that are not sore, cracked, or bleeding. Signs That Your Baby is Getting Enough Milk  Wetting at least 3 diapers in a 24-hour period. The urine should be clear and pale yellow by age 5 days.  At least 3 stools in a 24-hour period by age 5 days. The stool should be soft and yellow.  At least 3 stools in a 24-hour period by age 7 days. The stool should be seedy and yellow.  No loss of weight greater than 10% of birth weight during the first 3 days of age.  Average weight gain of 4-7 ounces (113-198 g) per week after age 4 days.  Consistent daily weight gain by age 5 days, without weight loss after the age of 2 weeks. After a feeding, your baby may spit up a small amount. This is common. BREASTFEEDING FREQUENCY AND DURATION Frequent feeding will help you make more milk and can prevent sore nipples and breast engorgement. Breastfeed when you feel the need to reduce the fullness of your breasts or when your baby shows signs of hunger. This is called "breastfeeding on demand." Avoid introducing a pacifier to your baby while you are working to establish breastfeeding (the first 4-6 weeks after your baby is born). After this time you may choose to use a pacifier. Research has shown that pacifier use during the first year of a baby's life decreases the risk of sudden infant death syndrome (SIDS). Allow your baby to feed on each breast as long as he or she wants. Breastfeed until your baby is finished feeding. When your baby unlatches or falls asleep while feeding from the first breast, offer the second breast.  Because newborns are often sleepy in the   first few weeks of life, you may need to awaken your baby to get him or her to feed. Breastfeeding times will vary from baby to baby. However, the following rules can serve as a guide to help you ensure that your baby is properly fed:  Newborns (babies 4 weeks of age or younger) may breastfeed every 1-3 hours.  Newborns should not go longer than 3 hours during the day or 5 hours during the night without breastfeeding.  You should breastfeed your baby a minimum of 8 times in a 24-hour period until you begin to introduce solid foods to your baby at around 6 months of age. BREAST MILK PUMPING Pumping and storing breast milk allows you to ensure that your baby is exclusively fed your breast milk, even at times when you are unable to breastfeed. This is especially important if you are going back to work while you are still breastfeeding or when you are not able to be present during feedings. Your lactation consultant can give you guidelines on how long it is safe to store breast milk.  A breast pump is a machine that allows you to pump milk from your breast into a sterile bottle. The pumped breast milk can then be stored in a refrigerator or freezer. Some breast pumps are operated by hand, while others use electricity. Ask your lactation consultant which type will work best for you. Breast pumps can be purchased, but some hospitals and breastfeeding support groups lease breast pumps on a monthly basis. A lactation consultant can teach you how to hand express breast milk, if you prefer not to use a pump.  CARING FOR YOUR BREASTS WHILE YOU BREASTFEED Nipples can become dry, cracked, and sore while breastfeeding. The following recommendations can help keep your breasts moisturized and healthy:  Avoid using soap on your nipples.   Wear a supportive bra. Although not required, special nursing bras and tank tops are designed to allow access to your breasts for  breastfeeding without taking off your entire bra or top. Avoid wearing underwire-style bras or extremely tight bras.  Air dry your nipples for 3-4minutes after each feeding.   Use only cotton bra pads to absorb leaked breast milk. Leaking of breast milk between feedings is normal.   Use lanolin on your nipples after breastfeeding. Lanolin helps to maintain your skin's normal moisture barrier. If you use pure lanolin, you do not need to wash it off before feeding your baby again. Pure lanolin is not toxic to your baby. You may also hand express a few drops of breast milk and gently massage that milk into your nipples and allow the milk to air dry. In the first few weeks after giving birth, some women experience extremely full breasts (engorgement). Engorgement can make your breasts feel heavy, warm, and tender to the touch. Engorgement peaks within 3-5 days after you give birth. The following recommendations can help ease engorgement:  Completely empty your breasts while breastfeeding or pumping. You may want to start by applying warm, moist heat (in the shower or with warm water-soaked hand towels) just before feeding or pumping. This increases circulation and helps the milk flow. If your baby does not completely empty your breasts while breastfeeding, pump any extra milk after he or she is finished.  Wear a snug bra (nursing or regular) or tank top for 1-2 days to signal your body to slightly decrease milk production.  Apply ice packs to your breasts, unless this is too uncomfortable for you.    Make sure that your baby is latched on and positioned properly while breastfeeding. If engorgement persists after 48 hours of following these recommendations, contact your health care provider or a lactation consultant. OVERALL HEALTH CARE RECOMMENDATIONS WHILE BREASTFEEDING  Eat healthy foods. Alternate between meals and snacks, eating 3 of each per day. Because what you eat affects your breast milk,  some of the foods may make your baby more irritable than usual. Avoid eating these foods if you are sure that they are negatively affecting your baby.  Drink milk, fruit juice, and water to satisfy your thirst (about 10 glasses a day).   Rest often, relax, and continue to take your prenatal vitamins to prevent fatigue, stress, and anemia.  Continue breast self-awareness checks.  Avoid chewing and smoking tobacco.  Avoid alcohol and drug use. Some medicines that may be harmful to your baby can pass through breast milk. It is important to ask your health care provider before taking any medicine, including all over-the-counter and prescription medicine as well as vitamin and herbal supplements. It is possible to become pregnant while breastfeeding. If birth control is desired, ask your health care provider about options that will be safe for your baby. SEEK MEDICAL CARE IF:   You feel like you want to stop breastfeeding or have become frustrated with breastfeeding.  You have painful breasts or nipples.  Your nipples are cracked or bleeding.  Your breasts are red, tender, or warm.  You have a swollen area on either breast.  You have a fever or chills.  You have nausea or vomiting.  You have drainage other than breast milk from your nipples.  Your breasts do not become full before feedings by the fifth day after you give birth.  You feel sad and depressed.  Your baby is too sleepy to eat well.  Your baby is having trouble sleeping.   Your baby is wetting less than 3 diapers in a 24-hour period.  Your baby has less than 3 stools in a 24-hour period.  Your baby's skin or the white part of his or her eyes becomes yellow.   Your baby is not gaining weight by 5 days of age. SEEK IMMEDIATE MEDICAL CARE IF:   Your baby is overly tired (lethargic) and does not want to wake up and feed.  Your baby develops an unexplained fever. Document Released: 05/21/2005 Document Revised:  05/26/2013 Document Reviewed: 11/12/2012 [employer] Patient Information 2015 [employer]. This information is not intended to replace advice given to you by your health care provider. Make sure you discuss any questions you have with your health care provider.  

## 2014-03-09 LAB — GC/CHLAMYDIA PROBE AMP
CT Probe RNA: NEGATIVE
GC Probe RNA: NEGATIVE

## 2014-03-11 ENCOUNTER — Encounter: Payer: Self-pay | Admitting: Family Medicine

## 2014-03-11 LAB — CULTURE, BETA STREP (GROUP B ONLY)

## 2014-03-15 ENCOUNTER — Ambulatory Visit (INDEPENDENT_AMBULATORY_CARE_PROVIDER_SITE_OTHER): Payer: No Typology Code available for payment source | Admitting: Obstetrics & Gynecology

## 2014-03-15 VITALS — BP 127/87 | HR 88 | Wt 155.0 lb

## 2014-03-15 DIAGNOSIS — Z3483 Encounter for supervision of other normal pregnancy, third trimester: Secondary | ICD-10-CM

## 2014-03-15 NOTE — Patient Instructions (Signed)
Return to clinic for any obstetric concerns or go to [facility] for evaluation  

## 2014-03-15 NOTE — Progress Notes (Signed)
GBS negative. No other complaints or concerns.  Labor and fetal movement precautions reviewed.

## 2014-03-22 ENCOUNTER — Ambulatory Visit (INDEPENDENT_AMBULATORY_CARE_PROVIDER_SITE_OTHER): Payer: No Typology Code available for payment source | Admitting: Family Medicine

## 2014-03-22 ENCOUNTER — Encounter: Payer: Self-pay | Admitting: Family Medicine

## 2014-03-22 VITALS — BP 124/80 | HR 82 | Wt 157.0 lb

## 2014-03-22 DIAGNOSIS — Z3483 Encounter for supervision of other normal pregnancy, third trimester: Secondary | ICD-10-CM

## 2014-03-22 NOTE — Progress Notes (Signed)
Doing well Few contractions

## 2014-03-22 NOTE — Patient Instructions (Signed)
Third Trimester of Pregnancy The third trimester is from week 29 through week 42, months 7 through 9. The third trimester is a time when the fetus is growing rapidly. At the end of the ninth month, the fetus is about 20 inches in length and weighs 6-10 pounds.  BODY CHANGES Your body goes through many changes during pregnancy. The changes vary from woman to woman.   Your weight will continue to increase. You can expect to gain 25-35 pounds (11-16 kg) by the end of the pregnancy.  You may begin to get stretch marks on your hips, abdomen, and breasts.  You may urinate more often because the fetus is moving lower into your pelvis and pressing on your bladder.  You may develop or continue to have heartburn as a result of your pregnancy.  You may develop constipation because certain hormones are causing the muscles that push waste through your intestines to slow down.  You may develop hemorrhoids or swollen, bulging veins (varicose veins).  You may have pelvic pain because of the weight gain and pregnancy hormones relaxing your joints between the bones in your pelvis. Backaches may result from overexertion of the muscles supporting your posture.  You may have changes in your hair. These can include thickening of your hair, rapid growth, and changes in texture. Some women also have hair loss during or after pregnancy, or hair that feels dry or thin. Your hair will most likely return to normal after your baby is born.  Your breasts will continue to grow and be tender. A yellow discharge may leak from your breasts called colostrum.  Your belly button may stick out.  You may feel short of breath because of your expanding uterus.  You may notice the fetus "dropping," or moving lower in your abdomen.  You may have a bloody mucus discharge. This usually occurs a few days to a week before labor begins.  Your cervix becomes thin and soft (effaced) near your due date. WHAT TO EXPECT AT YOUR  PRENATAL EXAMS  You will have prenatal exams every 2 weeks until week 36. Then, you will have weekly prenatal exams. During a routine prenatal visit:  You will be weighed to make sure you and the fetus are growing normally.  Your blood pressure is taken.  Your abdomen will be measured to track your baby's growth.  The fetal heartbeat will be listened to.  Any test results from the previous visit will be discussed.  You may have a cervical check near your due date to see if you have effaced. At around 36 weeks, your caregiver will check your cervix. At the same time, your caregiver will also perform a test on the secretions of the vaginal tissue. This test is to determine if a type of bacteria, Group B streptococcus, is present. Your caregiver will explain this further. Your caregiver may ask you:  What your birth plan is.  How you are feeling.  If you are feeling the baby move.  If you have had any abnormal symptoms, such as leaking fluid, bleeding, severe headaches, or abdominal cramping.  If you have any questions. Other tests or screenings that may be performed during your third trimester include:  Blood tests that check for low iron levels (anemia).  Fetal testing to check the health, activity level, and growth of the fetus. Testing is done if you have certain medical conditions or if there are problems during the pregnancy. FALSE LABOR You may feel small, irregular contractions that   eventually go away. These are called Braxton Hicks contractions, or false labor. Contractions may last for hours, days, or even weeks before true labor sets in. If contractions come at regular intervals, intensify, or become painful, it is best to be seen by your caregiver.  SIGNS OF LABOR   Menstrual-like cramps.  Contractions that are 5 minutes apart or less.  Contractions that start on the top of the uterus and spread down to the lower abdomen and back.  A sense of increased pelvic  pressure or back pain.  A watery or bloody mucus discharge that comes from the vagina. If you have any of these signs before the 37th week of pregnancy, call your caregiver right away. You need to go to the hospital to get checked immediately. HOME CARE INSTRUCTIONS   Avoid all smoking, herbs, alcohol, and unprescribed drugs. These chemicals affect the formation and growth of the baby.  Follow your caregiver's instructions regarding medicine use. There are medicines that are either safe or unsafe to take during pregnancy.  Exercise only as directed by your caregiver. Experiencing uterine cramps is a good sign to stop exercising.  Continue to eat regular, healthy meals.  Wear a good support bra for breast tenderness.  Do not use hot tubs, steam rooms, or saunas.  Wear your seat belt at all times when driving.  Avoid raw meat, uncooked cheese, cat litter boxes, and soil used by cats. These carry germs that can cause birth defects in the baby.  Take your prenatal vitamins.  Try taking a stool softener (if your caregiver approves) if you develop constipation. Eat more high-fiber foods, such as fresh vegetables or fruit and whole grains. Drink plenty of fluids to keep your urine clear or pale yellow.  Take warm sitz baths to soothe any pain or discomfort caused by hemorrhoids. Use hemorrhoid cream if your caregiver approves.  If you develop varicose veins, wear support hose. Elevate your feet for 15 minutes, 3-4 times a day. Limit salt in your diet.  Avoid heavy lifting, wear low heal shoes, and practice good posture.  Rest a lot with your legs elevated if you have leg cramps or low back pain.  Visit your dentist if you have not gone during your pregnancy. Use a soft toothbrush to brush your teeth and be gentle when you floss.  A sexual relationship may be continued unless your caregiver directs you otherwise.  Do not travel far distances unless it is absolutely necessary and only  with the approval of your caregiver.  Take prenatal classes to understand, practice, and ask questions about the labor and delivery.  Make a trial run to the hospital.  Pack your hospital bag.  Prepare the baby's nursery.  Continue to go to all your prenatal visits as directed by your caregiver. SEEK MEDICAL CARE IF:  You are unsure if you are in labor or if your water has broken.  You have dizziness.  You have mild pelvic cramps, pelvic pressure, or nagging pain in your abdominal area.  You have persistent nausea, vomiting, or diarrhea.  You have a bad smelling vaginal discharge.  You have pain with urination. SEEK IMMEDIATE MEDICAL CARE IF:   You have a fever.  You are leaking fluid from your vagina.  You have spotting or bleeding from your vagina.  You have severe abdominal cramping or pain.  You have rapid weight loss or gain.  You have shortness of breath with chest pain.  You notice sudden or extreme swelling   of your face, hands, ankles, feet, or legs.  You have not felt your baby move in over an hour.  You have severe headaches that do not go away with medicine.  You have vision changes. Document Released: 05/15/2001 Document Revised: 05/26/2013 Document Reviewed: 07/22/2012 [employer] Patient Information 2015 [employer]. This information is not intended to replace advice given to you by your health care provider. Make sure you discuss any questions you have with your health care provider.  Breastfeeding Deciding to breastfeed is one of the best choices you can make for you and your baby. A change in hormones during pregnancy causes your breast tissue to grow and increases the number and size of your milk ducts. These hormones also allow proteins, sugars, and fats from your blood supply to make breast milk in your milk-producing glands. Hormones prevent breast milk from being released before your baby is born as well as prompt milk flow after birth. Once  breastfeeding has begun, thoughts of your baby, as well as his or her sucking or crying, can stimulate the release of milk from your milk-producing glands.  BENEFITS OF BREASTFEEDING For Your Baby  Your first milk (colostrum) helps your baby's digestive system function better.   There are antibodies in your milk that help your baby fight off infections.   Your baby has a lower incidence of asthma, allergies, and sudden infant death syndrome.   The nutrients in breast milk are better for your baby than infant formulas and are designed uniquely for your baby's needs.   Breast milk improves your baby's brain development.   Your baby is less likely to develop other conditions, such as childhood obesity, asthma, or type 2 diabetes mellitus.  For You   Breastfeeding helps to create a very special bond between you and your baby.   Breastfeeding is convenient. Breast milk is always available at the correct temperature and costs nothing.   Breastfeeding helps to burn calories and helps you lose the weight gained during pregnancy.   Breastfeeding makes your uterus contract to its prepregnancy size faster and slows bleeding (lochia) after you give birth.   Breastfeeding helps to lower your risk of developing type 2 diabetes mellitus, osteoporosis, and breast or ovarian cancer later in life. SIGNS THAT YOUR BABY IS HUNGRY Early Signs of Hunger  Increased alertness or activity.  Stretching.  Movement of the head from side to side.  Movement of the head and opening of the mouth when the corner of the mouth or cheek is stroked (rooting).  Increased sucking sounds, smacking lips, cooing, sighing, or squeaking.  Hand-to-mouth movements.  Increased sucking of fingers or hands. Late Signs of Hunger  Fussing.  Intermittent crying. Extreme Signs of Hunger Signs of extreme hunger will require calming and consoling before your baby will be able to breastfeed successfully. Do not  wait for the following signs of extreme hunger to occur before you initiate breastfeeding:   Restlessness.  A loud, strong cry.   Screaming. BREASTFEEDING BASICS Breastfeeding Initiation  Find a comfortable place to sit or lie down, with your neck and back well supported.  Place a pillow or rolled up blanket under your baby to bring him or her to the level of your breast (if you are seated). Nursing pillows are specially designed to help support your arms and your baby while you breastfeed.  Make sure that your baby's abdomen is facing your abdomen.   Gently massage your breast. With your fingertips, massage from your chest   wall toward your nipple in a circular motion. This encourages milk flow. You may need to continue this action during the feeding if your milk flows slowly.  Support your breast with 4 fingers underneath and your thumb above your nipple. Make sure your fingers are well away from your nipple and your baby's mouth.   Stroke your baby's lips gently with your finger or nipple.   When your baby's mouth is open wide enough, quickly bring your baby to your breast, placing your entire nipple and as much of the colored area around your nipple (areola) as possible into your baby's mouth.   More areola should be visible above your baby's upper lip than below the lower lip.   Your baby's tongue should be between his or her lower gum and your breast.   Ensure that your baby's mouth is correctly positioned around your nipple (latched). Your baby's lips should create a seal on your breast and be turned out (everted).  It is common for your baby to suck about 2-3 minutes in order to start the flow of breast milk. Latching Teaching your baby how to latch on to your breast properly is very important. An improper latch can cause nipple pain and decreased milk supply for you and poor weight gain in your baby. Also, if your baby is not latched onto your nipple properly, he or she  may swallow some air during feeding. This can make your baby fussy. Burping your baby when you switch breasts during the feeding can help to get rid of the air. However, teaching your baby to latch on properly is still the best way to prevent fussiness from swallowing air while breastfeeding. Signs that your baby has successfully latched on to your nipple:    Silent tugging or silent sucking, without causing you pain.   Swallowing heard between every 3-4 sucks.    Muscle movement above and in front of his or her ears while sucking.  Signs that your baby has not successfully latched on to nipple:   Sucking sounds or smacking sounds from your baby while breastfeeding.  Nipple pain. If you think your baby has not latched on correctly, slip your finger into the corner of your baby's mouth to break the suction and place it between your baby's gums. Attempt breastfeeding initiation again. Signs of Successful Breastfeeding Signs from your baby:   A gradual decrease in the number of sucks or complete cessation of sucking.   Falling asleep.   Relaxation of his or her body.   Retention of a small amount of milk in his or her mouth.   Letting go of your breast by himself or herself. Signs from you:  Breasts that have increased in firmness, weight, and size 1-3 hours after feeding.   Breasts that are softer immediately after breastfeeding.  Increased milk volume, as well as a change in milk consistency and color by the fifth day of breastfeeding.   Nipples that are not sore, cracked, or bleeding. Signs That Your Baby is Getting Enough Milk  Wetting at least 3 diapers in a 24-hour period. The urine should be clear and pale yellow by age 5 days.  At least 3 stools in a 24-hour period by age 5 days. The stool should be soft and yellow.  At least 3 stools in a 24-hour period by age 7 days. The stool should be seedy and yellow.  No loss of weight greater than 10% of birth weight  during the first 3   days of age.  Average weight gain of 4-7 ounces (113-198 g) per week after age 4 days.  Consistent daily weight gain by age 5 days, without weight loss after the age of 2 weeks. After a feeding, your baby may spit up a small amount. This is common. BREASTFEEDING FREQUENCY AND DURATION Frequent feeding will help you make more milk and can prevent sore nipples and breast engorgement. Breastfeed when you feel the need to reduce the fullness of your breasts or when your baby shows signs of hunger. This is called "breastfeeding on demand." Avoid introducing a pacifier to your baby while you are working to establish breastfeeding (the first 4-6 weeks after your baby is born). After this time you may choose to use a pacifier. Research has shown that pacifier use during the first year of a baby's life decreases the risk of sudden infant death syndrome (SIDS). Allow your baby to feed on each breast as long as he or she wants. Breastfeed until your baby is finished feeding. When your baby unlatches or falls asleep while feeding from the first breast, offer the second breast. Because newborns are often sleepy in the first few weeks of life, you may need to awaken your baby to get him or her to feed. Breastfeeding times will vary from baby to baby. However, the following rules can serve as a guide to help you ensure that your baby is properly fed:  Newborns (babies 4 weeks of age or younger) may breastfeed every 1-3 hours.  Newborns should not go longer than 3 hours during the day or 5 hours during the night without breastfeeding.  You should breastfeed your baby a minimum of 8 times in a 24-hour period until you begin to introduce solid foods to your baby at around 6 months of age. BREAST MILK PUMPING Pumping and storing breast milk allows you to ensure that your baby is exclusively fed your breast milk, even at times when you are unable to breastfeed. This is especially important if you are  going back to work while you are still breastfeeding or when you are not able to be present during feedings. Your lactation consultant can give you guidelines on how long it is safe to store breast milk.  A breast pump is a machine that allows you to pump milk from your breast into a sterile bottle. The pumped breast milk can then be stored in a refrigerator or freezer. Some breast pumps are operated by hand, while others use electricity. Ask your lactation consultant which type will work best for you. Breast pumps can be purchased, but some hospitals and breastfeeding support groups lease breast pumps on a monthly basis. A lactation consultant can teach you how to hand express breast milk, if you prefer not to use a pump.  CARING FOR YOUR BREASTS WHILE YOU BREASTFEED Nipples can become dry, cracked, and sore while breastfeeding. The following recommendations can help keep your breasts moisturized and healthy:  Avoid using soap on your nipples.   Wear a supportive bra. Although not required, special nursing bras and tank tops are designed to allow access to your breasts for breastfeeding without taking off your entire bra or top. Avoid wearing underwire-style bras or extremely tight bras.  Air dry your nipples for 3-4minutes after each feeding.   Use only cotton bra pads to absorb leaked breast milk. Leaking of breast milk between feedings is normal.   Use lanolin on your nipples after breastfeeding. Lanolin helps to maintain your skin's   normal moisture barrier. If you use pure lanolin, you do not need to wash it off before feeding your baby again. Pure lanolin is not toxic to your baby. You may also hand express a few drops of breast milk and gently massage that milk into your nipples and allow the milk to air dry. In the first few weeks after giving birth, some women experience extremely full breasts (engorgement). Engorgement can make your breasts feel heavy, warm, and tender to the touch.  Engorgement peaks within 3-5 days after you give birth. The following recommendations can help ease engorgement:  Completely empty your breasts while breastfeeding or pumping. You may want to start by applying warm, moist heat (in the shower or with warm water-soaked hand towels) just before feeding or pumping. This increases circulation and helps the milk flow. If your baby does not completely empty your breasts while breastfeeding, pump any extra milk after he or she is finished.  Wear a snug bra (nursing or regular) or tank top for 1-2 days to signal your body to slightly decrease milk production.  Apply ice packs to your breasts, unless this is too uncomfortable for you.  Make sure that your baby is latched on and positioned properly while breastfeeding. If engorgement persists after 48 hours of following these recommendations, contact your health care provider or a lactation consultant. OVERALL HEALTH CARE RECOMMENDATIONS WHILE BREASTFEEDING  Eat healthy foods. Alternate between meals and snacks, eating 3 of each per day. Because what you eat affects your breast milk, some of the foods may make your baby more irritable than usual. Avoid eating these foods if you are sure that they are negatively affecting your baby.  Drink milk, fruit juice, and water to satisfy your thirst (about 10 glasses a day).   Rest often, relax, and continue to take your prenatal vitamins to prevent fatigue, stress, and anemia.  Continue breast self-awareness checks.  Avoid chewing and smoking tobacco.  Avoid alcohol and drug use. Some medicines that may be harmful to your baby can pass through breast milk. It is important to ask your health care provider before taking any medicine, including all over-the-counter and prescription medicine as well as vitamin and herbal supplements. It is possible to become pregnant while breastfeeding. If birth control is desired, ask your health care provider about options that  will be safe for your baby. SEEK MEDICAL CARE IF:   You feel like you want to stop breastfeeding or have become frustrated with breastfeeding.  You have painful breasts or nipples.  Your nipples are cracked or bleeding.  Your breasts are red, tender, or warm.  You have a swollen area on either breast.  You have a fever or chills.  You have nausea or vomiting.  You have drainage other than breast milk from your nipples.  Your breasts do not become full before feedings by the fifth day after you give birth.  You feel sad and depressed.  Your baby is too sleepy to eat well.  Your baby is having trouble sleeping.   Your baby is wetting less than 3 diapers in a 24-hour period.  Your baby has less than 3 stools in a 24-hour period.  Your baby's skin or the white part of his or her eyes becomes yellow.   Your baby is not gaining weight by 5 days of age. SEEK IMMEDIATE MEDICAL CARE IF:   Your baby is overly tired (lethargic) and does not want to wake up and feed.  Your baby   develops an unexplained fever. Document Released: 05/21/2005 Document Revised: 05/26/2013 Document Reviewed: 11/12/2012 [employer] Patient Information 2015 [employer]. This information is not intended to replace advice given to you by your health care provider. Make sure you discuss any questions you have with your health care provider.  

## 2014-03-29 ENCOUNTER — Ambulatory Visit (INDEPENDENT_AMBULATORY_CARE_PROVIDER_SITE_OTHER): Payer: No Typology Code available for payment source | Admitting: Family Medicine

## 2014-03-29 VITALS — BP 122/88 | HR 107 | Wt 158.0 lb

## 2014-03-29 DIAGNOSIS — Z3483 Encounter for supervision of other normal pregnancy, third trimester: Secondary | ICD-10-CM

## 2014-03-29 NOTE — Patient Instructions (Signed)
Third Trimester of Pregnancy The third trimester is from week 29 through week 42, months 7 through 9. This trimester is when your unborn baby (fetus) is growing very fast. At the end of the ninth month, the unborn baby is about 20 inches in length. It weighs about 6-10 pounds.  HOME CARE   Avoid all smoking, herbs, and alcohol. Avoid drugs not approved by your doctor.  Only take medicine as told by your doctor. Some medicines are safe and some are not during pregnancy.  Exercise only as told by your doctor. Stop exercising if you start having cramps.  Eat regular, healthy meals.  Wear a good support bra if your breasts are tender.  Do not use hot tubs, steam rooms, or saunas.  Wear your seat belt when driving.  Avoid raw meat, uncooked cheese, and liter boxes and soil used by cats.  Take your prenatal vitamins.  Try taking medicine that helps you poop (stool softener) as needed, and if your doctor approves. Eat more fiber by eating fresh fruit, vegetables, and whole grains. Drink enough fluids to keep your pee (urine) clear or pale yellow.  Take warm water baths (sitz baths) to soothe pain or discomfort caused by hemorrhoids. Use hemorrhoid cream if your doctor approves.  If you have puffy, bulging veins (varicose veins), wear support hose. Raise (elevate) your feet for 15 minutes, 3-4 times a day. Limit salt in your diet.  Avoid heavy lifting, wear low heels, and sit up straight.  Rest with your legs raised if you have leg cramps or low back pain.  Visit your dentist if you have not gone during your pregnancy. Use a soft toothbrush to brush your teeth. Be gentle when you floss.  You can have sex (intercourse) unless your doctor tells you not to.  Do not travel far distances unless you must. Only do so with your doctor's approval.  Take prenatal classes.  Practice driving to the hospital.  Pack your hospital bag.  Prepare the baby's room.  Go to your doctor visits. GET  HELP IF:  You are not sure if you are in labor or if your water has broken.  You are dizzy.  You have mild cramps or pressure in your lower belly (abdominal).  You have a nagging pain in your belly area.  You continue to feel sick to your stomach (nauseous), throw up (vomit), or have watery poop (diarrhea).  You have bad smelling fluid coming from your vagina.  You have pain with peeing (urination). GET HELP RIGHT AWAY IF:   You have a fever.  You are leaking fluid from your vagina.  You are spotting or bleeding from your vagina.  You have severe belly cramping or pain.  You lose or gain weight rapidly.  You have trouble catching your breath and have chest pain.  You notice sudden or extreme puffiness (swelling) of your face, hands, ankles, feet, or legs.  You have not felt the baby move in over an hour.  You have severe headaches that do not go away with medicine.  You have vision changes. Document Released: 08/15/2009 Document Revised: 09/15/2012 Document Reviewed: 07/22/2012 Exit[employer] Patient Information 2015 [employer]. This information is not intended to replace advice given to you by your health care provider. Make sure you discuss any questions you have with your health care provider.  

## 2014-03-29 NOTE — Progress Notes (Signed)
Good fetal movement.  Some contractions.  Good fetal movement.

## 2014-03-31 ENCOUNTER — Encounter (HOSPITAL_COMMUNITY): Payer: Self-pay | Admitting: *Deleted

## 2014-03-31 ENCOUNTER — Encounter (HOSPITAL_COMMUNITY): Payer: No Typology Code available for payment source | Admitting: Anesthesiology

## 2014-03-31 ENCOUNTER — Inpatient Hospital Stay (HOSPITAL_COMMUNITY): Payer: No Typology Code available for payment source | Admitting: Anesthesiology

## 2014-03-31 ENCOUNTER — Inpatient Hospital Stay (HOSPITAL_COMMUNITY)
Admission: AD | Admit: 2014-03-31 | Discharge: 2014-04-02 | DRG: 775 | Disposition: A | Discharge status: Home / Self Care | Payer: No Typology Code available for payment source | Source: Ambulatory Visit | Attending: Obstetrics & Gynecology | Admitting: Obstetrics & Gynecology

## 2014-03-31 DIAGNOSIS — Z2839 Encounter for supervision of normal pregnancy, unspecified, unspecified trimester: Secondary | ICD-10-CM

## 2014-03-31 DIAGNOSIS — O9989 Other specified diseases and conditions complicating pregnancy, childbirth and the puerperium: Secondary | ICD-10-CM

## 2014-03-31 DIAGNOSIS — Z3483 Encounter for supervision of other normal pregnancy, third trimester: Secondary | ICD-10-CM

## 2014-03-31 DIAGNOSIS — Z3A38 38 weeks gestation of pregnancy: Secondary | ICD-10-CM | POA: Diagnosis present

## 2014-03-31 DIAGNOSIS — Z283 Underimmunization status: Secondary | ICD-10-CM

## 2014-03-31 DIAGNOSIS — O471 False labor at or after 37 completed weeks of gestation: Secondary | ICD-10-CM | POA: Diagnosis present

## 2014-03-31 DIAGNOSIS — IMO0001 Reserved for inherently not codable concepts without codable children: Secondary | ICD-10-CM

## 2014-03-31 LAB — CBC
HCT: 43.5 % (ref 36.0–46.0)
Hemoglobin: 15 g/dL (ref 12.0–15.0)
MCH: 31.9 pg (ref 26.0–34.0)
MCHC: 34.5 g/dL (ref 30.0–36.0)
MCV: 92.6 fL (ref 78.0–100.0)
Platelets: 125 10*3/uL — ABNORMAL LOW (ref 150–400)
RBC: 4.7 MIL/uL (ref 3.87–5.11)
RDW: 13.5 % (ref 11.5–15.5)
WBC: 15.3 10*3/uL — ABNORMAL HIGH (ref 4.0–10.5)

## 2014-03-31 LAB — TYPE AND SCREEN
ABO/RH(D): A POS
Antibody Screen: NEGATIVE

## 2014-03-31 LAB — ABO/RH: ABO/RH(D): A POS

## 2014-03-31 MED ORDER — DIPHENHYDRAMINE HCL 50 MG/ML IJ SOLN: 12.5000 mg | INTRAMUSCULAR | Status: DC | PRN

## 2014-03-31 MED ORDER — ACETAMINOPHEN 325 MG PO TABS
650.0000 mg | ORAL_TABLET | ORAL | Status: DC | PRN
Start: 2014-03-31 — End: 2014-04-01

## 2014-03-31 MED ORDER — FLEET ENEMA 7-19 GM/118ML RE ENEM: 1.0000 | ENEMA | RECTAL | Status: DC | PRN

## 2014-03-31 MED ORDER — LACTATED RINGERS IV SOLN
500.0000 mL | Freq: Once | INTRAVENOUS | Status: AC
  Administered 2014-03-31: 500 mL via INTRAVENOUS

## 2014-03-31 MED ORDER — LIDOCAINE-EPINEPHRINE (PF) 2 %-1:200000 IJ SOLN
INTRAMUSCULAR | Status: DC | PRN
  Administered 2014-03-31: 3 mL

## 2014-03-31 MED ORDER — LIDOCAINE HCL (PF) 1 % IJ SOLN
30.0000 mL | INTRAMUSCULAR | Status: DC | PRN
  Filled 2014-03-31: qty 30

## 2014-03-31 MED ORDER — FENTANYL 2.5 MCG/ML BUPIVACAINE 1/10 % EPIDURAL INFUSION (WH - ANES)
14.0000 mL/h | INTRAMUSCULAR | Status: DC | PRN
  Administered 2014-03-31: 14 mL/h via EPIDURAL
  Filled 2014-03-31: qty 125

## 2014-03-31 MED ORDER — EPHEDRINE 5 MG/ML INJ
10.0000 mg | INTRAVENOUS | Status: DC | PRN
  Filled 2014-03-31: qty 2

## 2014-03-31 MED ORDER — OXYTOCIN 40 UNITS IN LACTATED RINGERS INFUSION - SIMPLE MED
62.5000 mL/h | INTRAVENOUS | Status: DC
  Administered 2014-04-01: 62.5 mL/h via INTRAVENOUS
  Filled 2014-03-31: qty 1000

## 2014-03-31 MED ORDER — CITRIC ACID-SODIUM CITRATE 334-500 MG/5ML PO SOLN: 30.0000 mL | ORAL | Status: DC | PRN

## 2014-03-31 MED ORDER — OXYTOCIN BOLUS FROM INFUSION
500.0000 mL | INTRAVENOUS | Status: DC
  Administered 2014-03-31: 500 mL via INTRAVENOUS

## 2014-03-31 MED ORDER — PHENYLEPHRINE 40 MCG/ML (10ML) SYRINGE FOR IV PUSH (FOR BLOOD PRESSURE SUPPORT)
80.0000 ug | PREFILLED_SYRINGE | INTRAVENOUS | Status: DC | PRN
  Filled 2014-03-31: qty 2

## 2014-03-31 MED ORDER — PHENYLEPHRINE 40 MCG/ML (10ML) SYRINGE FOR IV PUSH (FOR BLOOD PRESSURE SUPPORT)
80.0000 ug | PREFILLED_SYRINGE | INTRAVENOUS | Status: DC | PRN
  Filled 2014-03-31: qty 2
  Filled 2014-03-31: qty 10

## 2014-03-31 MED ORDER — LACTATED RINGERS IV SOLN
INTRAVENOUS | Status: DC
  Administered 2014-03-31 (×2): via INTRAVENOUS

## 2014-03-31 MED ORDER — FENTANYL 2.5 MCG/ML BUPIVACAINE 1/10 % EPIDURAL INFUSION (WH - ANES)
INTRAMUSCULAR | Status: DC | PRN
  Administered 2014-03-31: 14 mL/h via EPIDURAL

## 2014-03-31 MED ORDER — BUPIVACAINE HCL (PF) 0.25 % IJ SOLN
INTRAMUSCULAR | Status: DC | PRN
  Administered 2014-03-31 (×2): 4 mL via EPIDURAL

## 2014-03-31 MED ORDER — ONDANSETRON HCL 4 MG/2ML IJ SOLN
4.0000 mg | Freq: Four times a day (QID) | INTRAMUSCULAR | Status: DC | PRN
Start: 2014-03-31 — End: 2014-04-01

## 2014-03-31 MED ORDER — OXYCODONE-ACETAMINOPHEN 5-325 MG PO TABS: 2.0000 | ORAL_TABLET | ORAL | Status: DC | PRN

## 2014-03-31 MED ORDER — OXYCODONE-ACETAMINOPHEN 5-325 MG PO TABS: 1.0000 | ORAL_TABLET | ORAL | Status: DC | PRN

## 2014-03-31 MED ORDER — LACTATED RINGERS IV SOLN: 500.0000 mL | INTRAVENOUS | Status: DC | PRN

## 2014-03-31 NOTE — Anesthesia Preprocedure Evaluation (Signed)
Anesthesia Evaluation  Patient identified by MRN, date of birth, ID band Patient awake    Reviewed: Allergy & Precautions, H&P , NPO status , Patient's Chart, lab work & pertinent test results  History of Anesthesia Complications Negative for: history of anesthetic complications  Airway Mallampati: II  TM Distance: >3 FB Neck ROM: Full    Dental  (+) Teeth Intact   Pulmonary neg pulmonary ROS,          Cardiovascular negative cardio ROS  Rhythm:Regular     Neuro/Psych negative neurological ROS  negative psych ROS   GI/Hepatic negative GI ROS, Neg liver ROS,   Endo/Other  negative endocrine ROS  Renal/GU negative Renal ROS     Musculoskeletal   Abdominal   Peds  Hematology negative hematology ROS (+)   Anesthesia Other Findings   Reproductive/Obstetrics (+) Pregnancy                             Anesthesia Physical Anesthesia Plan  ASA: II  Anesthesia Plan: Epidural   Post-op Pain Management:    Induction:   Airway Management Planned:   Additional Equipment:   Intra-op Plan:   Post-operative Plan:   Informed Consent: I have reviewed the patients History and Physical, chart, labs and discussed the procedure including the risks, benefits and alternatives for the proposed anesthesia with the patient or authorized representative who has indicated his/her understanding and acceptance.     Plan Discussed with: Anesthesiologist  Anesthesia Plan Comments:         Anesthesia Quick Evaluation

## 2014-03-31 NOTE — MAU Note (Addendum)
Contractions every 5 min for last 45min to an hour.  Bloody show, no leaking.  Was 3 cm on Mon. Been contracting all day, but really picked up in the last hour

## 2014-03-31 NOTE — Progress Notes (Signed)
Joanna Nixon is a 28 y.o. G2P1001 at 751w6d   Subjective: Comfortable with epidural  Objective: BP 114/64  Pulse 91  Temp(Src) 98.8 F (37.1 C) (Oral)  Resp 20  Ht 5\' 3"  (1.6 m)  Wt 71.668 kg (158 lb)  BMI 28.00 kg/m2  SpO2 99%  LMP 07/02/2013      FHT:  FHR: 140s bpm, variability: moderate,  accelerations:  Present,  decelerations:  Absent UC:   regular, every 2-3 minutes SVE:  6-7/100/0; AROM for clear fluid Labs: Lab Results  Component Value Date   WBC 15.3* 03/31/2014   HGB 15.0 03/31/2014   HCT 43.5 03/31/2014   MCV 92.6 03/31/2014   PLT 125* 03/31/2014    Assessment / Plan: IUP @ term Active labor  Anticipate increasing labor and SVD soon  Cam HaiSHAW, Allean Montfort CNM 03/31/2014, 9:19 PM

## 2014-03-31 NOTE — H&P (Signed)
LABOR ADMISSION HISTORY AND PHYSICAL  Joanna Nixon is a 28 y.o. female G2P1001 with IUP at 9318w6d by LMP presenting for SOL. She reports +FMs, No LOF, no VB, no blurry vision, headaches or peripheral edema, and RUQ pain. She desires an epidural for labor pain control. She plans on breast and bottle feeding. She request condoms for birth control.  Dating: By LMP cw 8w sono --->  Estimated Date of Delivery: 04/08/14  Prenatal History/Complications:  Past Medical History: History reviewed. No pertinent past medical history.  Past Surgical History: Past Surgical History  Procedure Laterality Date  . No past surgeries      Obstetrical History: OB History   Grav Para Term Preterm Abortions TAB SAB Ect Mult Living   2 1 1       1       Social History: History   Social History  . Marital Status: Married    Spouse Name: N/A    Number of Children: N/A  . Years of Education: N/A   Social History Main Topics  . Smoking status: Never Smoker   . Smokeless tobacco: Never Used  . Alcohol Use: Yes     Comment: 3 days per week  . Drug Use: No  . Sexual Activity: Yes    Partners: Male    Birth Control/ Protection: None   Other Topics Concern  . None   Social History Narrative  . None    Family History: Family History  Problem Relation Age of Onset  . Hyperlipidemia Mother   . Hyperlipidemia Father     Allergies: No Known Allergies  Prescriptions prior to admission  Medication Sig Dispense Refill  . acetaminophen (TYLENOL) 325 MG tablet Take 650 mg by mouth every 6 (six) hours as needed (back pain).      Marland Kitchen. omeprazole (PRILOSEC) 20 MG capsule Take 20 mg by mouth daily as needed (heartburn).       . prenatal vitamin w/FE, FA (PRENATAL 1 + 1) 27-1 MG TABS tablet Take 1 tablet by mouth daily at 12 noon.         Review of Systems   All systems reviewed and negative except as stated in HPI  Blood pressure 115/73, pulse 91, temperature 98.8 F (37.1 C), temperature  source Oral, resp. rate 20, height 5\' 3"  (1.6 m), weight 158 lb (71.668 kg), last menstrual period 07/02/2013. General appearance: alert, cooperative and appears stated age Lungs: clear to auscultation bilaterally Heart: regular rate and rhythm Abdomen: soft, non-tender; bowel sounds normal Pelvic: adequate Extremities: Homans sign is negative, no sign of DVT DTR's not examined Presentation: cephalic Fetal monitoringBaseline: 140 bpm, Variability: Good {> 6 bpm), Accelerations: Reactive and Decelerations: Absent Uterine activity q2-614min   Dilation: 5.5 Effacement (%): 90 Station: -1 Exam by:: Wyvonne Carda   Prenatal labs: ABO, Rh: --/--/A POS, A POS (10/28 1715) Antibody: NEG (10/28 1715) Rubella:   RPR: NON REAC (08/17 1525)  HBsAg: NEGATIVE (03/12 1351)  HIV: NONREACTIVE (08/17 1525)  GBS: Negative (10/05 0000)  1 hr Glucola 98 Genetic screening  declined Anatomy US normal with incomplete anatomy  [facility]  Dating LMP + 8 wk u/s  Genetic Screen Declines  Anatomic US WNL but incomplete anatomy--unable to visualize RVOT, cavum -> patient declines f/u  GTT Third trimester: 98  TDaP vaccine 01/18/14  GBS neg  Contraception Condoms  Baby Food Breast  Circumcision Yes  Pediatrician [facility] (Dr. Chelsea PrimusMinter)  Support Person Husband    Results for orders  placed during the hospital encounter of 03/31/14 (from the past 24 hour(s))  CBC   Collection Time    03/31/14  5:15 PM      Result Value Ref Range   WBC 15.3 (*) 4.0 - 10.5 K/uL   RBC 4.70  3.87 - 5.11 MIL/uL   Hemoglobin 15.0  12.0 - 15.0 g/dL   HCT 16.143.5  09.636.0 - 04.546.0 %   MCV 92.6  78.0 - 100.0 fL   MCH 31.9  26.0 - 34.0 pg   MCHC 34.5  30.0 - 36.0 g/dL   RDW 40.913.5  81.111.5 - 91.415.5 %   Platelets 125 (*) 150 - 400 K/uL  TYPE AND SCREEN   Collection Time    03/31/14  5:15 PM      Result Value Ref Range   ABO/RH(D) A POS     Antibody Screen NEG     Sample Expiration 04/03/2014    ABO/RH   Collection  Time    03/31/14  5:15 PM      Result Value Ref Range   ABO/RH(D) A POS      Patient Active Problem List   Diagnosis Date Noted  . Active labor 03/31/2014  . Supervision of other normal pregnancy 08/31/2013  . Rubella non-immune status, antepartum 08/14/2013    Assessment: Joanna Nixon is a 28 y.o. G2P1001 at 5982w6d here for SOL   #Labor: progressing normally #Pain: epidural #FWB: Cat I #ID:  GBS neg #MOF: breast #MOC: condoms #Circ:  Desires inpatient  Hitoshi Werts ROCIO 03/31/2014, 8:35 PM

## 2014-03-31 NOTE — Anesthesia Procedure Notes (Signed)
Epidural Patient location during procedure: OB Start time: 03/31/2014 8:39 PM End time: 03/31/2014 8:54 PM  Staffing Anesthesiologist: Perrie Ragin, CHRIS Performed by: anesthesiologist   Preanesthetic Checklist Completed: patient identified, surgical consent, pre-op evaluation, timeout performed, IV checked, risks and benefits discussed and monitors and equipment checked  Epidural Patient position: sitting Prep: site prepped and draped and DuraPrep Patient monitoring: heart rate, cardiac monitor, continuous pulse ox and blood pressure Approach: midline Location: L4-L5 Injection technique: LOR saline  Needle:  Needle type: Tuohy  Needle gauge: 17 G Needle length: 9 cm Needle insertion depth: 5 cm Catheter type: closed end flexible Catheter size: 19 Gauge Catheter at skin depth: 12 cm Test dose: negative and 2% lidocaine with Epi 1:200 K  Assessment Events: blood not aspirated, injection not painful, no injection resistance, negative IV test and no paresthesia  Additional Notes H+P and labs checked, risks and benefits discussed with the patient, consent obtained, procedure tolerated well and without complications.  Reason for block:procedure for pain

## 2014-04-01 ENCOUNTER — Encounter (HOSPITAL_COMMUNITY): Payer: Self-pay | Admitting: *Deleted

## 2014-04-01 DIAGNOSIS — Z3A38 38 weeks gestation of pregnancy: Secondary | ICD-10-CM

## 2014-04-01 LAB — CBC
HCT: 38.4 % (ref 36.0–46.0)
Hemoglobin: 13.3 g/dL (ref 12.0–15.0)
MCH: 32.3 pg (ref 26.0–34.0)
MCHC: 34.6 g/dL (ref 30.0–36.0)
MCV: 93.2 fL (ref 78.0–100.0)
Platelets: 124 10*3/uL — ABNORMAL LOW (ref 150–400)
RBC: 4.12 MIL/uL (ref 3.87–5.11)
RDW: 13.2 % (ref 11.5–15.5)
WBC: 16.1 10*3/uL — ABNORMAL HIGH (ref 4.0–10.5)

## 2014-04-01 LAB — RPR

## 2014-04-01 LAB — HIV ANTIBODY (ROUTINE TESTING W REFLEX): HIV 1&2 Ab, 4th Generation: NONREACTIVE

## 2014-04-01 MED ORDER — WITCH HAZEL-GLYCERIN EX PADS: 1.0000 "application " | MEDICATED_PAD | CUTANEOUS | Status: DC | PRN

## 2014-04-01 MED ORDER — DIBUCAINE 1 % RE OINT
1.0000 | TOPICAL_OINTMENT | RECTAL | Status: DC | PRN
Start: 2014-04-01 — End: 2014-04-02

## 2014-04-01 MED ORDER — OXYCODONE-ACETAMINOPHEN 5-325 MG PO TABS: 2.0000 | ORAL_TABLET | ORAL | Status: DC | PRN

## 2014-04-01 MED ORDER — BENZOCAINE-MENTHOL 20-0.5 % EX AERO: 1.0000 "application " | INHALATION_SPRAY | CUTANEOUS | Status: DC | PRN

## 2014-04-01 MED ORDER — PRENATAL MULTIVITAMIN CH
1.0000 | ORAL_TABLET | Freq: Every day | ORAL | Status: DC
  Administered 2014-04-01 – 2014-04-02 (×2): 1 via ORAL
  Filled 2014-04-01 (×2): qty 1

## 2014-04-01 MED ORDER — ZOLPIDEM TARTRATE 5 MG PO TABS: 5.0000 mg | ORAL_TABLET | Freq: Every evening | ORAL | Status: DC | PRN

## 2014-04-01 MED ORDER — DIPHENHYDRAMINE HCL 25 MG PO CAPS: 25.0000 mg | ORAL_CAPSULE | Freq: Four times a day (QID) | ORAL | Status: DC | PRN

## 2014-04-01 MED ORDER — OXYCODONE-ACETAMINOPHEN 5-325 MG PO TABS: 1.0000 | ORAL_TABLET | ORAL | Status: DC | PRN

## 2014-04-01 MED ORDER — SIMETHICONE 80 MG PO CHEW: 80.0000 mg | CHEWABLE_TABLET | ORAL | Status: DC | PRN

## 2014-04-01 MED ORDER — ONDANSETRON HCL 4 MG PO TABS: 4.0000 mg | ORAL_TABLET | ORAL | Status: DC | PRN

## 2014-04-01 MED ORDER — ONDANSETRON HCL 4 MG/2ML IJ SOLN: 4.0000 mg | INTRAMUSCULAR | Status: DC | PRN

## 2014-04-01 MED ORDER — LANOLIN HYDROUS EX OINT: TOPICAL_OINTMENT | CUTANEOUS | Status: DC | PRN

## 2014-04-01 MED ORDER — TETANUS-DIPHTH-ACELL PERTUSSIS 5-2.5-18.5 LF-MCG/0.5 IM SUSP: 0.5000 mL | Freq: Once | INTRAMUSCULAR | Status: DC

## 2014-04-01 MED ORDER — SENNOSIDES-DOCUSATE SODIUM 8.6-50 MG PO TABS
2.0000 | ORAL_TABLET | ORAL | Status: DC
  Administered 2014-04-02: 2 via ORAL
  Filled 2014-04-01: qty 2

## 2014-04-01 MED ORDER — IBUPROFEN 600 MG PO TABS
600.0000 mg | ORAL_TABLET | Freq: Four times a day (QID) | ORAL | Status: DC
  Administered 2014-04-01 – 2014-04-02 (×6): 600 mg via ORAL
  Filled 2014-04-01 (×6): qty 1

## 2014-04-01 NOTE — Progress Notes (Signed)
Post Partum Day 1 Subjective: Joanna Nixon is a 28 y.o. [id number] 6578w6d s/p SVD. No acute events overnight. Pt denies problems with ambulating, voiding or po intake. She denies nausea or vomiting. Pain is well controlled. She has not had flatus. She has not had bowel movement. Lochia Moderate. Plan for birth control POPs. Method of Feeding: breast. Plan for circumcision here.    Objective: Blood pressure 113/61, pulse 85, temperature 98.1 F (36.7 C), temperature source Oral, resp. rate 18, height 5\' 3"  (1.6 m), weight 71.668 kg (158 lb), last menstrual period 07/02/2013, SpO2 97.00%, unknown if currently breastfeeding.  Physical Exam:  General: alert and cooperative Cardiovascular: RRR. No murmurs, rubs, or gallops Chest: Normal effort. Lungs CTA bilaterally.  Lochia: appropriate Uterine Fundus: firm DVT Evaluation: No evidence of DVT seen on physical exam.   Recent Labs  03/31/14 1715 04/01/14 0015  HGB 15.0 13.3  HCT 43.5 38.4    Assessment/Plan: Plan for discharge tomorrow   LOS: 1 day   Janalyn RouseHoward, Tayla 04/01/2014, 7:44 AM   I was present for the exam and agree with above. Circ delayed per Peds. Pt desires D/C early tomorrow. Will try to schedule circ as early as possible.    [place] Audra Kagel, CNM 04/01/2014 11:24 AM

## 2014-04-01 NOTE — Lactation Note (Addendum)
This note was copied from the chart of Joanna Nixon Goffe. Lactation Consultation Note  Patient Name: Joanna Nixon Resetar ZOXWR'UToday's Date: 04/01/2014 Reason for consult: Initial assessment Baby 16 hours of life. Mom reports baby latching and nursing well. Mom states that she had a lot of problems latching first baby and she did not have a good supply of milk. Offered to see a latch and to help with hand expression. Mom refused stating that she would ask for help later if she needs it. Mom states that she has not seen colostrum yet, but that baby seems to be nursing well, swallowing, and contented after the BF. Mom denies nipple pain. [facility], aware of OP/BFSG, community resources, and [facility] phone line assistance. Enc mom to offer lots of STS and feed with cues.  Maternal Data Does the patient have breastfeeding experience prior to this delivery?: Yes  Feeding Feeding Type:  (Mom reports baby just finished nursing.)  [facility]ventions                      Lactation Tools Discussed/Used     Consult Status Consult Status: PRN    Geralynn OchsWILLIARD, Ashleah Valtierra 04/01/2014, 4:02 PM

## 2014-04-01 NOTE — H&P (Signed)
Attestation of Attending Supervision of Fellow: Evaluation and management procedures were performed by the Fellow under my supervision and collaboration.  I have reviewed the Fellow's note and chart, and I agree with the management and plan.    

## 2014-04-02 MED ORDER — IBUPROFEN 600 MG PO TABS: 600.0000 mg | ORAL_TABLET | Freq: Four times a day (QID) | ORAL | Status: DC | PRN

## 2014-04-02 MED ORDER — MEASLES, MUMPS & RUBELLA VAC ~~LOC~~ INJ
0.5000 mL | INJECTION | Freq: Once | SUBCUTANEOUS | Status: AC
  Administered 2014-04-02: 0.5 mL via SUBCUTANEOUS
  Filled 2014-04-02: qty 0.5

## 2014-04-02 NOTE — Discharge Summary (Signed)
Pt. Seen and examined, agree with note as documented above by PA-S.  She reports minimal lochia. VSS. Fundus firm. Desires circumcision.  Informed consent obtained.  Breast feeding and POP's--start at 4 wks.  Pelvic rest.  F/u pp visit in 6 wks.

## 2014-04-02 NOTE — Lactation Note (Signed)
This note was copied from the chart of Joanna Nixon [place]. Lactation Consultation Note  P2, First time Breastfeeding. Mother was attempting to breastfeed in cradle position with no support pillows. Suggested she switch to football hold and roll baby tummy to tummy. Sucks and swallows observed.  Reviewed how to massage to keep him active. Reviewed how to use compression to achieve a deeper latch.  Mothers nipples are sore, pink with small abrasions. Mother has been using comfort gels.  Provided mother with another pack of gels. Reviewed engorgement care and provided mother with a hand pump.   Patient Name: Joanna Nixon Arrazola ZOXWR'UToday's Date: 04/02/2014 Reason for consult: Follow-up assessment   Maternal Data    Feeding    LATCH Score/Interventions Latch: Grasps breast easily, tongue down, lips flanged, rhythmical sucking. Intervention(s): Adjust position;Assist with latch;Breast massage;Breast compression  Audible Swallowing: Spontaneous and intermittent  Type of Nipple: Everted at rest and after stimulation  Comfort (Breast/Nipple): Filling, red/small blisters or bruises, mild/mod discomfort  Problem noted: Mild/Moderate discomfort Interventions (Mild/moderate discomfort): Comfort gels  Hold (Positioning): Assistance needed to correctly position infant at breast and maintain latch.  LATCH Score: 8  Lactation Tools Discussed/Used     Consult Status Consult Status: Follow-up Date: 04/03/14 Follow-up type: In-patient    Dahlia ByesBerkelhammer, [facility] 04/02/2014, 12:10 PM

## 2014-04-02 NOTE — Discharge Summary (Signed)
Obstetric Discharge Summary Reason for Admission: rupture of membranes Prenatal Procedures: none Intrapartum Procedures: spontaneous vaginal delivery Postpartum Procedures: none Complications-Operative and Postpartum: none  Pt is a 28 y/o G2P2002 at 3867w6d presented with spontaneous rupture of membranes went on to NSVD with epidural anesthesia. 3 vessel cord, spontaneous placenta. EBL 200 mL, no laceration. Apgar's 9 and 9. Mom is breast feeding, using POPs for contraception. Wants inpatient circumcision this morning.   Hemoglobin  Date Value Ref Range Status  04/01/2014 13.3  12.0 - 15.0 g/dL Final     HCT  Date Value Ref Range Status  04/01/2014 38.4  36.0 - 46.0 % Final    Physical Exam:  General: alert and cooperative Lochia: appropriate Uterine Fundus: firm DVT Evaluation: No evidence of DVT seen on physical exam.  Discharge Diagnoses: Term Pregnancy-delivered  Discharge Information: Date: 04/02/2014 Activity: pelvic rest Diet: routine Medications: Ibuprofen Condition: stable Instructions: refer to practice specific booklet Discharge to: home   Newborn Data: Live born female  Birth Weight: 7 lb 12.7 oz (3535 g) APGAR: 9, 9  Home with mother.  Janalyn RouseHoward, Yaileen Hofferber 04/02/2014, 7:42 AM

## 2014-04-02 NOTE — Discharge Instructions (Signed)
Home Care Instructions for Mom °After discharge, you may discover that you still have questions about body changes, activity, and care during the next few weeks. The following information should be helpful in answering many of your questions. °ACTIVITY °· Gradually resume your daily activities at home. °· Allow time for rest periods during the day and nap while your newborn sleeps. °· Avoid heavy lifting (more than 10 lb [4.5 kg]) and strenuous work or sports. Slow to moderate walking is usually safe. °· If you had a cesarean delivery, do not vacuum, climb stairs, or drive a car for 4 to 6 weeks. °· If you had a cesarean delivery, make arrangements for help at home until you feel you are okay to do your usual activities yourself. °· Ask your caregiver for safe exercises to do after delivery, especially if you had a cesarean delivery. °VAGINAL FLOW AND RETURN OF YOUR MENSTRUAL PERIOD °· Vaginal flow may continue for 4 to 6 weeks after delivery. °· Usually the amount decreases and the color of blood gets lighter. °· Bright red blood and an increased flow may reoccur if you have been too active. °· Lie down, raise (elevate) your feet, place a cold compress on your lower abdomen, rest, and call your caregiver if you are soaking more than 1 pad an hour or passing large clots. °· Menstrual periods will usually return 6 to 8 weeks after delivery. °· If you are breastfeeding, your period will return anywhere from 8 weeks to the time you stop breastfeeding. °PERINEAL CARE °· Use the peri bottle and change pads each time you go to the bathroom. °· Use towelettes in place of toilet paper until stitches are healed. °· Take warm tub baths for 15 to 20 minutes. °· Continue to use medicated pads or pain relieving spray. °· Lidocaine cream for episiotomy pain can be used with your caregiver's approval. °· Do not use tampons or douches until vaginal bleeding has stopped (about 4 weeks). °· Sexual intercourse should be avoided for at  least 3 to 4 weeks after delivery or until the brownish-red vaginal flow is completely gone. °· Wipe from front to back. °INCISION CARE (AFTER A CESAREAN DELIVERY) °· Shower as desired. Try to keep your incision dry. °BOWELS AND HEMORRHOIDS °· Drink at least 6 to 8 glasses of non-caffeinated fluids per day. °· Eat fiber-rich diet with whole grains, raw fruits, and vegetables. °· Take frequent warm tub baths if hemorrhoids are a problem. °· Avoid straining when having a bowel movement. °· Over-the-counter medicines and stool softeners can be used as directed by your caregiver. °NUTRITION °· Eat a well-balanced diet that includes the basic food groups. °· Do not try to lose weight quickly by cutting back on calories. °· If you are breastfeeding, drink at least 8 to 10 glasses of non-caffeinated fluid per day and increase your intake by 600 calories a day. °· Take your prenatal vitamins until your postpartum checkup or until your caregiver tells you to stop. °BREASTFEEDING °If you are not breastfeeding: °· Wear a good tight-fitting bra. °· Limit fluid intake after 1 or 2 days after delivery, or as directed, if your breasts are becoming engorged. °· Avoid nipple stimulation and apply cool (not icy cold) compresses to the breasts for comfort as needed. °¨ Avoid drinking alcohol and caffeinated drinks. °· Mild over-the-counter pain medication recommended by your caregiver is helpful for breast discomfort. °· Medications to dry up breast milk are not recommended. °If you are breastfeeding: °· Encourage   your newborn to breastfeed if you think he or she is hungry. °· Wash your hands before breastfeeding. °· Clean your breasts with warm water before nursing. °· Start to encourage feeding 8 to 12 times a day for 10 to 15 minutes on each breast in the beginning to help stimulate milk production and train your newborn. °· Avoid water and formula supplements for your newborn unless otherwise directed. °· Have your newborn seen by  his or her caregiver 3 to 5 days after delivery and again at 2 to 3 weeks to evaluate his or her progress with breastfeeding. °· Call your newborn's caregiver if you think he or she is not gaining weight or may be losing weight. °POSTPARTUM BLUES °Following delivery, your body goes through a drastic change in hormone levels. You may find yourself crying for no apparent reason and unable to cope with all the changes a newborn brings. Get support from your partner and friends. Give yourself time to adjust. If these feelings persist after several weeks, contact your caregiver or other professionals that can help you. °Call your local emergency department, go to the emergency room, or get help right away from a relative, friend, or neighbor if you feel you are about to harm yourself, your newborn, or anyone else. °EXERCISES °Start Kegel exercises right after delivery. You can do it while standing, sitting, or lying down. Tighten your stomach muscles and the muscles surrounding your birth canal. Hold for a few seconds and then relax. Repeat 5 times each time. Make Kegel exercises a part of your daily routine to maintain the muscle tone that supports your vagina, bladder, and bowels. °SELF BREAST EXAMINATION °· Do breast self-exams at the same time of the month, each month. °· Any lump, bump, or discharge should be reported to your caregiver. °· Check your breasts, if you are breastfeeding, just after a feeding when your breasts are less full. If your period has started and you are breastfeeding, check your breasts on the fifth to seventh day of your period. °· Breasts are normally lumpy if you are breastfeeding due to the fullness of the milk cells. This is temporary and not a health risk. °INTIMACY AND SEXUALITY °New parents need time to adjust to each other intimately and sexually after giving birth. Try to spend time as a couple discussing ways to adjust to your infant, your new schedule, and how to meet both your  desires and needs. Counseling can be helpful in deeply troubled cases. °If you are breastfeeding or not yet having a menstrual period, you can get pregnant. Use some form of birth control to prevent getting pregnant. Talk to your caregiver about the birth control choices that are available to you for you situation. °SEEK IMMEDIATE MEDICAL CARE IF:  °· Drainage increases from the Cesarean incision, episiotomy or tear site, or the drainage starts to smell bad. °· You soak pads with blood in 1 hour or less. °· You have severe lower abdominal pain or cramping. °· You have a bad smelling vaginal discharge. °· You have increased rather than decreased pain around stitches or swelling, redness, or hardness in area. °· You have an oral temperature above 102° F (38.9° C), not controlled by medicine. °· You have pain or redness in the calf of the leg. °· You feel sick to your stomach (nausea) and throw up (vomit) for 12 hours. °· You have sudden, severe chest pain. °· You have shortness of breath. °· You have painful or bloody urination. °·   You have visual problems. °· You develop a severe headache. °· An area of your breast is red and sore, and you have a fever. You may feel like you have flu symptoms. °Document Released: 05/18/2000 Document Revised: 08/13/2011 Document Reviewed: 10/17/2009 °ExitCare® Patient Information ©2015 [employer]. This information is not intended to replace advice given to you by your health care provider. Make sure you discuss any questions you have with your health care provider. ° °

## 2014-04-05 ENCOUNTER — Encounter: Payer: No Typology Code available for payment source | Admitting: Obstetrics & Gynecology

## 2014-04-05 ENCOUNTER — Encounter (HOSPITAL_COMMUNITY): Payer: Self-pay | Admitting: *Deleted

## 2014-04-15 ENCOUNTER — Telehealth: Payer: Self-pay | Admitting: *Deleted

## 2014-04-16 ENCOUNTER — Encounter (INDEPENDENT_AMBULATORY_CARE_PROVIDER_SITE_OTHER): Payer: Self-pay | Admitting: *Deleted

## 2014-04-16 NOTE — Telephone Encounter (Signed)
Disability paperwork has been filled out and faxed over to patients work.

## 2014-04-16 NOTE — Progress Notes (Signed)
FMLA and disability forms filled out and faxed to patients employer per her request.

## 2014-05-25 ENCOUNTER — Ambulatory Visit (INDEPENDENT_AMBULATORY_CARE_PROVIDER_SITE_OTHER): Payer: No Typology Code available for payment source | Admitting: Family Medicine

## 2014-05-25 ENCOUNTER — Encounter: Payer: Self-pay | Admitting: Family Medicine

## 2014-05-25 VITALS — BP 108/76 | HR 97 | Ht 63.0 in | Wt 128.0 lb

## 2014-05-25 DIAGNOSIS — Z30011 Encounter for initial prescription of contraceptive pills: Secondary | ICD-10-CM

## 2014-05-25 MED ORDER — NORGESTIMATE-ETH ESTRADIOL 0.25-35 MG-MCG PO TABS: 1.0000 | ORAL_TABLET | Freq: Every day | ORAL | Status: DC

## 2014-05-25 NOTE — Progress Notes (Signed)
  Subjective:     Joanna Nixon is a 28 y.o. female who presents for a postpartum visit. She is 6 weeks postpartum following a spontaneous vaginal delivery. I have fully reviewed the prenatal and intrapartum course. The delivery was at 38 gestational weeks. Outcome: spontaneous vaginal delivery. Anesthesia: epidural. Postpartum course has been normal. Baby's course has been unremarkable. Baby is feeding by bottle - Enfamil with Iron. Bleeding no bleeding. Bowel function is normal. Bladder function is normal. Patient is sexually active. Contraception method is none. Postpartum depression screening: negative.  The following portions of the patient's history were reviewed and updated as appropriate: allergies, current medications, past family history, past medical history, past social history, past surgical history and problem list.  Review of Systems Pertinent items are noted in HPI.   Objective:    BP 108/76 mmHg  Pulse 97  Ht 5\' 3"  (1.6 m)  Wt 128 lb (58.06 kg)  BMI 22.68 kg/m2  Breastfeeding? No  General:  alert and cooperative  Abdomen: soft, non-tender; bowel sounds normal; no masses,  no organomegaly   Vulva:  normal  Vagina: normal vagina, no discharge, exudate, lesion, or erythema  Cervix:  multiparous appearance  Corpus: normal size, contour, position, consistency, mobility, non-tender  Adnexa:  normal adnexa        Assessment:     Normal postpartum exam. Pap smear not done at today's visit.   Plan:    1. Contraception: OCP (estrogen/progesterone) 2. Follow up in: 1 year or as needed.

## 2014-05-25 NOTE — Patient Instructions (Signed)

## 2017-03-18 ENCOUNTER — Ambulatory Visit (INDEPENDENT_AMBULATORY_CARE_PROVIDER_SITE_OTHER): Payer: 59 | Admitting: Internal Medicine

## 2017-03-18 ENCOUNTER — Encounter: Payer: Self-pay | Admitting: Internal Medicine

## 2017-03-18 VITALS — BP 110/80 | HR 100 | Temp 98.6°F | Resp 14 | Ht 63.39 in | Wt 136.4 lb

## 2017-03-18 DIAGNOSIS — Z13220 Encounter for screening for lipoid disorders: Secondary | ICD-10-CM

## 2017-03-18 DIAGNOSIS — Z862 Personal history of diseases of the blood and blood-forming organs and certain disorders involving the immune mechanism: Secondary | ICD-10-CM

## 2017-03-18 DIAGNOSIS — F419 Anxiety disorder, unspecified: Secondary | ICD-10-CM

## 2017-03-18 MED ORDER — BUSPIRONE HCL 5 MG PO TABS: 5.0000 mg | ORAL_TABLET | Freq: Every day | ORAL | 0 refills | Status: DC | PRN

## 2017-03-18 NOTE — Progress Notes (Signed)
Patient ID: Joanna Nixon, female   DOB: [birth year], 31 y.o.   MRN: [id number]   Subjective:    Patient ID: Joanna Nixon, female    DOB: 1985/10/10, 31 y.o.   MRN: [id number]  HPI  Patient here to establish care.  States she is doing well.  Feels good.  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  [facility]off[facility]'s daughter).  Stays active.  No chest pain.  No sob.  No acid reflux.  No abdominal pain.  Bowels moving.  States she has been going to [facility].  Has never had abnormal pap smear.  Overall feels good.  Does report increased anxiety.  Notices is affecting her when out, etc.  Desires to have something to take prn.  No depression.  Does not affect her job.     History reviewed. No pertinent past medical history. Past Surgical History:  Procedure Laterality Date  . NO PAST SURGERIES     Family History  Problem Relation Age of Onset  . Hyperlipidemia Mother   . Heart attack Mother        09/2015  . Hyperlipidemia Father    Social History   Social History  . Marital status: Married    Spouse name: N/A  . Number of children: N/A  . Years of education: N/A   Social History Main Topics  . Smoking status: Never Smoker  . Smokeless tobacco: Never Used  . Alcohol use Yes     Comment: 3 days per week  . Drug use: No  . Sexual activity: Yes    Partners: Male    Birth control/ protection: None   Other Topics Concern  . None   Social History Narrative  . None    Outpatient Encounter Prescriptions as of 03/18/2017  Medication Sig  . busPIRone (BUSPAR) 5 MG tablet Take 1 tablet (5 mg total) by mouth daily as needed.  . [DISCONTINUED] norgestimate-ethinyl estradiol (ORTHO-CYCLEN,SPRINTEC,PREVIFEM) 0.25-35 MG-MCG tablet Take 1 tablet by mouth daily.   No facility-administered encounter medications on file as of 03/18/2017.     Review of Systems  Constitutional: Negative for appetite change and unexpected weight change.  HENT: Negative for  congestion and sinus pressure.   Respiratory: Negative for cough, chest tightness and shortness of breath.   Cardiovascular: Negative for chest pain, palpitations and leg swelling.  Gastrointestinal: Negative for abdominal pain, diarrhea, nausea and vomiting.  Genitourinary: Negative for difficulty urinating and dysuria.  Musculoskeletal: Negative for back pain and joint swelling.  Skin: Negative for color change and rash.  Neurological: Negative for dizziness, light-headedness and headaches.  Psychiatric/Behavioral: Negative for agitation and dysphoric mood.       Objective:    Physical Exam  Constitutional: She appears well-developed and well-nourished. No distress.  HENT:  Nose: Nose normal.  Mouth/Throat: Oropharynx is clear and moist.  Neck: Neck supple. No thyromegaly present.  Cardiovascular: Normal rate and regular rhythm.   Pulmonary/Chest: Breath sounds normal. No respiratory distress. She has no wheezes.  Abdominal: Soft. Bowel sounds are normal. There is no tenderness.  Musculoskeletal: She exhibits no edema or tenderness.  Lymphadenopathy:    She has no cervical adenopathy.  Skin: No rash noted. No erythema.  Psychiatric: She has a normal mood and affect. Her behavior is normal.    BP 110/80 (BP Location: Left Arm, Patient Position: Sitting, Cuff Size: Normal)   Pulse 100   Temp 98.6 F (37 C) (Oral)   Resp 14  Ht 5' 3.39" (1.61 m)   Wt 136 lb 6.4 oz (61.9 kg)   LMP 03/11/2017   SpO2 96%   BMI 23.87 kg/m  Wt Readings from Last 3 Encounters:  03/18/17 136 lb 6.4 oz (61.9 kg)  05/25/14 128 lb (58.1 kg)  03/31/14 158 lb (71.7 kg)     Lab Results  Component Value Date   WBC 16.1 (H) 04/01/2014   HGB 13.3 04/01/2014   HCT 38.4 04/01/2014   PLT 124 (L) 04/01/2014   GLUCOSE 64 (L) 06/21/2010   ALT 12 06/21/2010   AST 17 06/21/2010   NA 137 06/21/2010   K 4.5 06/21/2010   CL 100 06/21/2010   CREATININE 0.72 06/21/2010   BUN 13 06/21/2010   CO2 26  06/21/2010   TSH 3.979 06/21/2010       Assessment & Plan:   Problem List Items Addressed This Visit    Anxiety    Increased anxiety as outlined.  Discussed with her today.  She request to have something to take as needed.  Discussed treatment options.  Will give her a trial of buspar.  Follow closely.  She request to hold on f/u - until January.        Relevant Medications   busPIRone (BUSPAR) 5 MG tablet   Other Relevant Orders   Comprehensive metabolic panel   TSH    Other Visit Diagnoses    History of leukocytosis    -  Primary   Relevant Orders   CBC with Differential/Platelet   Screening cholesterol level       Relevant Orders   Lipid panel       Dale [place], MD

## 2017-03-21 ENCOUNTER — Encounter: Payer: Self-pay | Admitting: Internal Medicine

## 2017-03-21 DIAGNOSIS — F419 Anxiety disorder, unspecified: Secondary | ICD-10-CM | POA: Insufficient documentation

## 2017-03-21 NOTE — Assessment & Plan Note (Signed)
Increased anxiety as outlined.  Discussed with her today.  She request to have something to take as needed.  Discussed treatment options.  Will give her a trial of buspar.  Follow closely.  She request to hold on f/u - until January.

## 2017-04-14 ENCOUNTER — Other Ambulatory Visit: Payer: Self-pay | Admitting: Internal Medicine

## 2017-05-13 ENCOUNTER — Other Ambulatory Visit: Payer: Self-pay | Admitting: Internal Medicine

## 2017-06-25 ENCOUNTER — Other Ambulatory Visit (INDEPENDENT_AMBULATORY_CARE_PROVIDER_SITE_OTHER): Payer: 59

## 2017-06-25 DIAGNOSIS — Z13220 Encounter for screening for lipoid disorders: Secondary | ICD-10-CM | POA: Diagnosis not present

## 2017-06-25 DIAGNOSIS — F419 Anxiety disorder, unspecified: Secondary | ICD-10-CM

## 2017-06-25 DIAGNOSIS — Z862 Personal history of diseases of the blood and blood-forming organs and certain disorders involving the immune mechanism: Secondary | ICD-10-CM | POA: Diagnosis not present

## 2017-06-25 LAB — COMPREHENSIVE METABOLIC PANEL
ALK PHOS: 45 U/L (ref 39–117)
ALT: 11 U/L (ref 0–35)
AST: 14 U/L (ref 0–37)
Albumin: 4.5 g/dL (ref 3.5–5.2)
BILIRUBIN TOTAL: 0.5 mg/dL (ref 0.2–1.2)
BUN: 12 mg/dL (ref 6–23)
CO2: 27 meq/L (ref 19–32)
CREATININE: 0.8 mg/dL (ref 0.40–1.20)
Calcium: 8.8 mg/dL (ref 8.4–10.5)
Chloride: 105 mEq/L (ref 96–112)
GFR: 87.62 mL/min (ref >60.00)
Glucose, Bld: 98 mg/dL (ref 70–99)
Potassium: 4.1 mEq/L (ref 3.5–5.1)
Sodium: 138 mEq/L (ref 135–145)
TOTAL PROTEIN: 7.1 g/dL (ref 6.0–8.3)

## 2017-06-25 LAB — CBC WITH DIFFERENTIAL/PLATELET
BASOS ABS: 0 10*3/uL (ref 0.0–0.1)
Basophils Relative: 0.3 % (ref 0.0–3.0)
EOS ABS: 0.1 10*3/uL (ref 0.0–0.7)
Eosinophils Relative: 1.5 % (ref 0.0–5.0)
HCT: 42.8 % (ref 36.0–46.0)
Hemoglobin: 14.4 g/dL (ref 12.0–15.0)
Lymphocytes Relative: 32.4 % (ref 12.0–46.0)
Lymphs Abs: 2.2 10*3/uL (ref 0.7–4.0)
MCHC: 33.7 g/dL (ref 30.0–36.0)
MCV: 89.3 fl (ref 78.0–100.0)
MONOS PCT: 6.8 % (ref 3.0–12.0)
Monocytes Absolute: 0.5 10*3/uL (ref 0.1–1.0)
NEUTROS ABS: 4 10*3/uL (ref 1.4–7.7)
NEUTROS PCT: 59 % (ref 43.0–77.0)
PLATELETS: 185 10*3/uL (ref 150.0–400.0)
RBC: 4.79 Mil/uL (ref 3.87–5.11)
RDW: 13 % (ref 11.5–15.5)
WBC: 6.8 10*3/uL (ref 4.0–10.5)

## 2017-06-25 LAB — LIPID PANEL
CHOLESTEROL: 178 mg/dL (ref 0–200)
HDL: 51 mg/dL (ref >39.00)
LDL Cholesterol: 105 mg/dL — ABNORMAL HIGH (ref 0–99)
NONHDL: 126.68
Total CHOL/HDL Ratio: 3
Triglycerides: 108 mg/dL (ref 0.0–149.0)
VLDL: 21.6 mg/dL (ref 0.0–40.0)

## 2017-06-25 LAB — TSH: TSH: 3.67 u[IU]/mL (ref 0.35–4.50)

## 2017-06-27 ENCOUNTER — Other Ambulatory Visit (HOSPITAL_COMMUNITY)
Admission: RE | Admit: 2017-06-27 | Discharge: 2017-06-27 | Disposition: A | Discharge status: Home / Self Care | Payer: 59 | Source: Ambulatory Visit | Attending: Internal Medicine | Admitting: Internal Medicine

## 2017-06-27 ENCOUNTER — Other Ambulatory Visit (HOSPITAL_COMMUNITY): Admission: RE | Admit: 2017-06-27 | Payer: 59 | Source: Ambulatory Visit

## 2017-06-27 ENCOUNTER — Ambulatory Visit: Payer: 59 | Admitting: Internal Medicine

## 2017-06-27 VITALS — BP 112/68 | HR 80 | Temp 98.5°F | Resp 16 | Wt 136.6 lb

## 2017-06-27 DIAGNOSIS — Z0000 Encounter for general adult medical examination without abnormal findings: Secondary | ICD-10-CM | POA: Diagnosis not present

## 2017-06-27 DIAGNOSIS — Z124 Encounter for screening for malignant neoplasm of cervix: Secondary | ICD-10-CM | POA: Insufficient documentation

## 2017-06-27 DIAGNOSIS — F419 Anxiety disorder, unspecified: Secondary | ICD-10-CM | POA: Diagnosis not present

## 2017-06-27 MED ORDER — BUSPIRONE HCL 10 MG PO TABS: ORAL_TABLET | ORAL | 1 refills | Status: DC

## 2017-06-27 NOTE — Progress Notes (Signed)
Patient ID: Joanna Nixon, female   DOB: 02/07/1986, 32 y.o.   MRN: [id number]   Subjective:    Patient ID: Joanna Nixon, female    DOB: 20-Oct-1985, 32 y.o.   MRN: [id number]  HPI  Patient here for her physical exam  States she is doing well.  Feels good.  No chest pain.  No sob.  No acid reflux. No abdominal pain.  Bowels moving.  No urine change.  Due to start her period soon.  Was haivng issues with increased stress and anxiety.  See last note.  Started on buspar.  Takes prn.  Does not feel helped.  Discussed taking on a more regular basis and discussed increasing the dose.  Also discussed other treatment options.     History reviewed. No pertinent past medical history. Past Surgical History:  Procedure Laterality Date  . NO PAST SURGERIES     Family History  Problem Relation Age of Onset  . Hyperlipidemia Mother   . Heart attack Mother        09/2015  . Hyperlipidemia Father    Social History   Socioeconomic History  . Marital status: Married    Spouse name: None  . Number of children: None  . Years of education: None  . Highest education level: None  Social Needs  . Financial resource strain: None  . Food insecurity - worry: None  . Food insecurity - inability: None  . Transportation needs - medical: None  . Transportation needs - non-medical: None  Occupational History  . None  Tobacco Use  . Smoking status: Never Smoker  . Smokeless tobacco: Never Used  Substance and Sexual Activity  . Alcohol use: Yes    Comment: 3 days per week  . Drug use: No  . Sexual activity: Yes    Partners: Male    Birth control/protection: None  Other Topics Concern  . None  Social History Narrative  . None    Outpatient Encounter Medications as of 06/27/2017  Medication Sig  . busPIRone (BUSPAR) 10 MG tablet TAKE 1TABLET (5 MG TOTAL) BY MOUTH DAILY AS NEEDED.  . [DISCONTINUED] busPIRone (BUSPAR) 10 MG tablet TAKE 1/2 TABLET (5 MG TOTAL) BY MOUTH DAILY AS NEEDED.  .  [DISCONTINUED] busPIRone (BUSPAR) 10 MG tablet TAKE 1/2 TABLET (5 MG TOTAL) BY MOUTH DAILY AS NEEDED.   No facility-administered encounter medications on file as of 06/27/2017.     Review of Systems  Constitutional: Negative for appetite change and unexpected weight change.  HENT: Negative for congestion and sinus pressure.   Eyes: Negative for pain and visual disturbance.  Respiratory: Negative for cough, chest tightness and shortness of breath.   Cardiovascular: Negative for chest pain, palpitations and leg swelling.  Gastrointestinal: Negative for abdominal pain, diarrhea, nausea and vomiting.  Genitourinary: Negative for difficulty urinating and dysuria.  Musculoskeletal: Negative for back pain and joint swelling.  Skin: Negative for color change and rash.  Neurological: Negative for dizziness, light-headedness and headaches.  Hematological: Negative for adenopathy. Does not bruise/bleed easily.  Psychiatric/Behavioral: Negative for agitation and dysphoric mood.       Increased stress as outlined.         Objective:    Physical Exam  Constitutional: She is oriented to person, place, and time. She appears well-developed and well-nourished. No distress.  HENT:  Nose: Nose normal.  Mouth/Throat: Oropharynx is clear and moist.  Eyes: Right eye exhibits no discharge. Left eye exhibits no discharge. No scleral icterus.  Neck: Neck supple. No thyromegaly present.  Cardiovascular: Normal rate and regular rhythm.  Pulmonary/Chest: Breath sounds normal. No accessory muscle usage. No tachypnea. No respiratory distress. She has no decreased breath sounds. She has no wheezes. She has no rhonchi. Right breast exhibits no inverted nipple, no mass, no nipple discharge and no tenderness (no axillary adenopathy). Left breast exhibits no inverted nipple, no mass, no nipple discharge and no tenderness (no axilarry adenopathy).  Abdominal: Soft. Bowel sounds are normal. There is no tenderness.    Genitourinary:  Genitourinary Comments: Normal external genitalia.  Vaginal vault without lesions.  Cervix identified.  Pap smear performed.  Could not appreciate any adnexal masses or tenderness.    Musculoskeletal: She exhibits no edema or tenderness.  Lymphadenopathy:    She has no cervical adenopathy.  Neurological: She is alert and oriented to person, place, and time.  Skin: Skin is warm. No rash noted. No erythema.  Psychiatric: She has a normal mood and affect. Her behavior is normal.    BP 112/68 (BP Location: Left Arm, Patient Position: Sitting, Cuff Size: Normal)   Pulse 80   Temp 98.5 F (36.9 C) (Oral)   Resp 16   Wt 136 lb 9.6 oz (62 kg)   SpO2 99%   BMI 23.90 kg/m  Wt Readings from Last 3 Encounters:  06/27/17 136 lb 9.6 oz (62 kg)  03/18/17 136 lb 6.4 oz (61.9 kg)  05/25/14 128 lb (58.1 kg)     Lab Results  Component Value Date   WBC 6.8 06/25/2017   HGB 14.4 06/25/2017   HCT 42.8 06/25/2017   PLT 185.0 06/25/2017   GLUCOSE 98 06/25/2017   CHOL 178 06/25/2017   TRIG 108.0 06/25/2017   HDL 51.00 06/25/2017   LDLCALC 105 (H) 06/25/2017   ALT 11 06/25/2017   AST 14 06/25/2017   NA 138 06/25/2017   K 4.1 06/25/2017   CL 105 06/25/2017   CREATININE 0.80 06/25/2017   BUN 12 06/25/2017   CO2 27 06/25/2017   TSH 3.67 06/25/2017       Assessment & Plan:   Problem List Items Addressed This Visit    Anxiety    Discussed with her today.  Discussed other treatment options. She wants to try buspar on a regular basis. Follow.  She will notify me if feels needs something more.        Relevant Medications   busPIRone (BUSPAR) 10 MG tablet   Healthcare maintenance    Physical today 06/27/17.  PAP 06/27/17.         Other Visit Diagnoses    Routine general medical examination at a health care facility    -  Primary   Screening for cervical cancer       Relevant Orders   Cytology - PAP       Dale DurhamSCOTT, Augusto Deckman, MD

## 2017-06-30 ENCOUNTER — Encounter: Payer: Self-pay | Admitting: Internal Medicine

## 2017-06-30 DIAGNOSIS — Z0000 Encounter for general adult medical examination without abnormal findings: Secondary | ICD-10-CM | POA: Insufficient documentation

## 2017-06-30 NOTE — Assessment & Plan Note (Signed)
Discussed with her today.  Discussed other treatment options. She wants to try buspar on a regular basis. Follow.  She will notify me if feels needs something more.

## 2017-06-30 NOTE — Assessment & Plan Note (Signed)
Physical today 06/27/17.  PAP 06/27/17.

## 2017-07-05 LAB — CYTOLOGY - PAP
DIAGNOSIS: NEGATIVE
HPV: DETECTED — AB

## 2017-07-16 ENCOUNTER — Other Ambulatory Visit: Payer: Self-pay | Admitting: Internal Medicine

## 2017-07-30 ENCOUNTER — Telehealth: Payer: Self-pay | Admitting: Internal Medicine

## 2017-07-30 NOTE — Telephone Encounter (Signed)
Copied from CRM [phone]. Topic: Quick Communication - See Telephone Encounter >> Jul 30, 2017  8:33 AM Cipriano BunkerLambe, Annette S wrote: CRM for notification. See Telephone encounter for:   busPIRone (BUSPAR) 5 MG tablet -   pt called saying wants to switch medicine, this one is not agreeing with her, has had headaches and more anxiety, fatigue   [id number] [facility] - [address] [place] [id number] Phone: [phone] Fax: [phone]     07/30/17.

## 2017-07-30 NOTE — Telephone Encounter (Signed)
Patient feels Buspar is not agreeing with her, having more anxiety and headaches. She wants to switch medications.  Last OV: 06/27/17 PCP: Lorin PicketScott Pharmacy: [id number] [place] - [address] [phone] (Fax)

## 2017-07-31 NOTE — Telephone Encounter (Signed)
Please advise 

## 2017-08-01 NOTE — Telephone Encounter (Signed)
LMTCB

## 2017-08-01 NOTE — Telephone Encounter (Signed)
Would you like for me to offer pt work in appt?

## 2017-08-01 NOTE — Telephone Encounter (Signed)
She will need appt to discuss if persistent issues.

## 2017-08-02 NOTE — Telephone Encounter (Signed)
Would prefer to see pt and discuss given persistent issues with anxiety.

## 2017-08-02 NOTE — Telephone Encounter (Signed)
Please advise 

## 2017-08-02 NOTE — Telephone Encounter (Signed)
LMTCB

## 2017-08-02 NOTE — Telephone Encounter (Signed)
Spoke with pt and she stated that she was told to just let Dr. Lorin PicketScott know how the buspar 5mg  was working for her. Pt stated that she was told to take it PRN when she started feeling anxious. Pt stated that she tried it PRN and could not even tell she had taken the medication. So pt thought she would try taking it daily to see if that would help and she stated that it actually made her feel worse. She stated that with taking it daily it gave her headaches, dizziness, and fatigue. Pt stated that she thought maybe she was just dehydrated so she tried drinking more water but that didn't help. Pt stopped taking the medication and she felt much better. Pt is wanting to know if there is something else that can be called in? She stated that Dr. Lorin PicketScott mentioned some other medications during their visit but the pt couldn't remember what they were called. Pt stated that we do not have to call her back if Dr. Lorin PicketScott is willing to just [facility] [address] [place] NephewPt stated that she would know what it was for. Please advise.

## 2017-08-02 NOTE — Telephone Encounter (Signed)
Copied from CRM [id number]. Topic: Quick Communication - Office Called Patient >> Aug 01, 2017  4:40 PM Stephannie LiSimmons, Janett L, [place] wrote: Reason for CRM: Patient returned call Bethann Berkshirerisha she would like another call on her cell

## 2017-08-06 NOTE — Telephone Encounter (Signed)
Spoke with pt to let her know that you would prefer her to come in for an appt to discuss issues. 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 Do you have any availability to work this pt in this week?

## 2017-08-06 NOTE — Telephone Encounter (Signed)
The only time I would be able to work her in this week would be Wednesday at lunch - 1:00.  Please schedule.

## 2017-08-06 NOTE — Telephone Encounter (Signed)
Pt is aware of appt date and time. 

## 2017-08-07 ENCOUNTER — Ambulatory Visit (INDEPENDENT_AMBULATORY_CARE_PROVIDER_SITE_OTHER): Payer: 59 | Admitting: Internal Medicine

## 2017-08-07 ENCOUNTER — Encounter: Payer: Self-pay | Admitting: Internal Medicine

## 2017-08-07 DIAGNOSIS — F419 Anxiety disorder, unspecified: Secondary | ICD-10-CM | POA: Diagnosis not present

## 2017-08-07 MED ORDER — ALPRAZOLAM 0.25 MG PO TABS: 0.2500 mg | ORAL_TABLET | Freq: Every day | ORAL | 0 refills | Status: DC | PRN

## 2017-08-07 NOTE — Progress Notes (Signed)
Patient ID: Joanna Nixon, female   DOB: [birth year], 32 y.o.   MRN: [id number]   Subjective:    Patient ID: Joanna Nixon, female    DOB: 07-Jun-1985, 32 y.o.   MRN: [id number]  HPI  Patient here for work in appt to discussed some increased anxiety.  Last visit was started on buspar.  This made her feel worse and more anxious.  She states she only needs something for certain social situations.  No depression.  Working and doing well.  Breathing stable.  No abdominal pain.  Overall feels good.  Discussed treatment options.     History reviewed. No pertinent past medical history. Past Surgical History:  Procedure Laterality Date  . NO PAST SURGERIES     Family History  Problem Relation Age of Onset  . Hyperlipidemia Mother   . Heart attack Mother        09/2015  . Hyperlipidemia Father    Social History   Socioeconomic History  . Marital status: Married    Spouse name: None  . Number of children: None  . Years of education: None  . Highest education level: None  Social Needs  . Financial resource strain: None  . Food insecurity - worry: None  . Food insecurity - inability: None  . Transportation needs - medical: None  . Transportation needs - non-medical: None  Occupational History  . None  Tobacco Use  . Smoking status: Never Smoker  . Smokeless tobacco: Never Used  Substance and Sexual Activity  . Alcohol use: Yes    Comment: 3 days per week  . Drug use: No  . Sexual activity: Yes    Partners: Male    Birth control/protection: None  Other Topics Concern  . None  Social History Narrative  . None    Outpatient Encounter Medications as of 08/07/2017  Medication Sig  . ALPRAZolam (XANAX) 0.25 MG tablet Take 1 tablet (0.25 mg total) by mouth daily as needed for anxiety.  . busPIRone (BUSPAR) 10 MG tablet TAKE 1TABLET (5 MG TOTAL) BY MOUTH DAILY AS NEEDED. (Patient not taking: Reported on 08/07/2017)  . busPIRone (BUSPAR) 5 MG tablet TAKE 1 TABLET BY MOUTH DAILY AS  NEEDED (Patient not taking: Reported on 08/07/2017)   No facility-administered encounter medications on file as of 08/07/2017.     Review of Systems  Constitutional: Negative for appetite change and unexpected weight change.  HENT: Negative for congestion and sinus pressure.   Respiratory: Negative for cough and shortness of breath.   Gastrointestinal: Negative for abdominal pain, diarrhea, nausea and vomiting.  Genitourinary: Negative for difficulty urinating and dysuria.  Neurological: Negative for dizziness, light-headedness and headaches.  Psychiatric/Behavioral:       Increased anxiety as outlined.         Objective:    Physical Exam  Constitutional: She appears well-developed and well-nourished. No distress.  Neck: Neck supple. No thyromegaly present.  Cardiovascular: Normal rate and regular rhythm.  Pulmonary/Chest: Breath sounds normal. No respiratory distress. She has no wheezes.  Abdominal: Soft. Bowel sounds are normal. There is no tenderness.  Musculoskeletal: She exhibits no edema or tenderness.  Lymphadenopathy:    She has no cervical adenopathy.  Skin: No rash noted. No erythema.  Psychiatric: She has a normal mood and affect. Her behavior is normal.    BP 118/70 (BP Location: Left Arm, Patient Position: Sitting, Cuff Size: Normal)   Pulse 90   Temp 98.2 F (36.8 C) (Oral)   Resp 18  Wt 135 lb 9.6 oz (61.5 kg)   SpO2 100%   BMI 23.73 kg/m  Wt Readings from Last 3 Encounters:  08/07/17 135 lb 9.6 oz (61.5 kg)  06/27/17 136 lb 9.6 oz (62 kg)  03/18/17 136 lb 6.4 oz (61.9 kg)     Lab Results  Component Value Date   WBC 6.8 06/25/2017   HGB 14.4 06/25/2017   HCT 42.8 06/25/2017   PLT 185.0 06/25/2017   GLUCOSE 98 06/25/2017   CHOL 178 06/25/2017   TRIG 108.0 06/25/2017   HDL 51.00 06/25/2017   LDLCALC 105 (H) 06/25/2017   ALT 11 06/25/2017   AST 14 06/25/2017   NA 138 06/25/2017   K 4.1 06/25/2017   CL 105 06/25/2017   CREATININE 0.80 06/25/2017    BUN 12 06/25/2017   CO2 27 06/25/2017   TSH 3.67 06/25/2017       Assessment & Plan:   Problem List Items Addressed This Visit    Anxiety    Only occurs intermittently.  Discussed with her today.  Did not tolerate buspar.  Will prescribe xanax.  Discussed medication and proper use.  Follow.  Notify me if problems.  Discussed not to take if concern regarding pregnancy.        Relevant Medications   ALPRAZolam (XANAX) 0.25 MG tablet       Dale DurhamSCOTT, Elina Streng, MD

## 2017-08-10 ENCOUNTER — Encounter: Payer: Self-pay | Admitting: Internal Medicine

## 2017-08-10 NOTE — Assessment & Plan Note (Signed)
Only occurs intermittently.  Discussed with her today.  Did not tolerate buspar.  Will prescribe xanax.  Discussed medication and proper use.  Follow.  Notify me if problems.  Discussed not to take if concern regarding pregnancy.

## 2017-11-29 ENCOUNTER — Other Ambulatory Visit: Payer: Self-pay | Admitting: Internal Medicine

## 2017-11-29 NOTE — Telephone Encounter (Signed)
Copied from CRM [phone]. Topic: Quick Communication - Rx Refill/Question >> Nov 29, 2017  3:24 PM Alexander BergeronBarksdale, Harvey B wrote: Medication: ALPRAZolam Prudy Feeler(XANAX) 0.25 MG tablet [[id number]][[id number]]   Has the patient contacted their pharmacy? Yes.   (Agent: If no, request that the patient contact the pharmacy for the refill.) (Agent: If yes, when and what did the pharmacy advise?)  [facility]erred Pharm[facility] (with phone number [facility] street [facility]  Agent: Please be advised that RX refills may take up to 3 business days. We ask that you follow-up with your pharmacy.

## 2017-11-30 NOTE — Telephone Encounter (Signed)
Xanax refill Last OV:08/07/17 Last refill:08/07/17 30 tab/0refill ZOX:WRUEAPCP:[id number]- [place], [place] - [address] [phone] (Phone) [phone] (Fax)

## 2017-12-02 MED ORDER — ALPRAZOLAM 0.25 MG PO TABS: 0.2500 mg | ORAL_TABLET | Freq: Every day | ORAL | 0 refills | Status: DC | PRN

## 2017-12-02 NOTE — Telephone Encounter (Signed)
See msg below

## 2017-12-02 NOTE — Telephone Encounter (Signed)
rx refill

## 2018-01-27 ENCOUNTER — Telehealth: Payer: Self-pay | Admitting: *Deleted

## 2018-01-27 NOTE — Telephone Encounter (Signed)
Copied from CRM [phone]. Topic: Appointment Scheduling - Scheduling Inquiry for Clinic >> Jan 27, 2018  9:31 AM Jolayne Hainesaylor, Brittany L wrote: Reason for CRM: patient had to cancel her appointment for 8/29 for a repeat on her abnormal pap smear. She can not get off of work and wants to know can Dr Lorin PicketScott work her back in?  [phone]

## 2018-01-28 NOTE — Telephone Encounter (Signed)
Pt scheduled  

## 2018-01-30 ENCOUNTER — Ambulatory Visit: Payer: 59 | Admitting: Internal Medicine

## 2018-02-10 ENCOUNTER — Other Ambulatory Visit (HOSPITAL_COMMUNITY)
Admission: RE | Admit: 2018-02-10 | Discharge: 2018-02-10 | Disposition: A | Discharge status: Home / Self Care | Payer: 59 | Source: Ambulatory Visit | Attending: Internal Medicine | Admitting: Internal Medicine

## 2018-02-10 ENCOUNTER — Encounter: Payer: Self-pay | Admitting: Internal Medicine

## 2018-02-10 ENCOUNTER — Ambulatory Visit: Payer: 59 | Admitting: Internal Medicine

## 2018-02-10 VITALS — BP 102/62 | HR 93 | Temp 98.6°F | Resp 18 | Wt 134.0 lb

## 2018-02-10 DIAGNOSIS — E0789 Other specified disorders of thyroid: Secondary | ICD-10-CM | POA: Diagnosis not present

## 2018-02-10 DIAGNOSIS — Z124 Encounter for screening for malignant neoplasm of cervix: Secondary | ICD-10-CM

## 2018-02-10 DIAGNOSIS — F419 Anxiety disorder, unspecified: Secondary | ICD-10-CM | POA: Diagnosis not present

## 2018-02-10 DIAGNOSIS — R87810 Cervical high risk human papillomavirus (HPV) DNA test positive: Secondary | ICD-10-CM | POA: Diagnosis not present

## 2018-02-10 NOTE — Progress Notes (Signed)
Patient ID: INDYIA BORIS, female   DOB: Aug 28, 1985, 32 y.o.   MRN: [id number]   Subjective:    Patient ID: Karin Golden, female    DOB: 10-Aug-1985, 32 y.o.   MRN: [id number]  HPI  Patient here for a scheduled follow up and for repeat pap smear.  She reports she is doing well.  Feels good.  Periods regular.  Due to start soon.  No chest pain.  No sob.  No increased cough or congestion.  No acid reflux.  No abdominal pain.  Bowels moving.  Here for f/u pap.  Discussed.  Anxiety  - better.  Takes xanax prn.    History reviewed. No pertinent past medical history. Past Surgical History:  Procedure Laterality Date  . NO PAST SURGERIES     Family History  Problem Relation Age of Onset  . Hyperlipidemia Mother   . Heart attack Mother        09/2015  . Hyperlipidemia Father    Social History   Socioeconomic History  . Marital status: Married    Spouse name: Not on file  . Number of children: Not on file  . Years of education: Not on file  . Highest education level: Not on file  Occupational History  . Not on file  Social Needs  . Financial resource strain: Not on file  . Food insecurity:    Worry: Not on file    Inability: Not on file  . Transportation needs:    Medical: Not on file    Non-medical: Not on file  Tobacco Use  . Smoking status: Never Smoker  . Smokeless tobacco: Never Used  Substance and Sexual Activity  . Alcohol use: Yes    Comment: 3 days per week  . Drug use: No  . Sexual activity: Yes    Partners: Male    Birth control/protection: None  Lifestyle  . Physical activity:    Days per week: Not on file    Minutes per session: Not on file  . Stress: Not on file  Relationships  . Social connections:    Talks on phone: Not on file    Gets together: Not on file    Attends religious service: Not on file    Active member of club or organization: Not on file    Attends meetings of clubs or organizations: Not on file    Relationship status: Not on file    Other Topics Concern  . Not on file  Social History Narrative  . Not on file    Outpatient Encounter Medications as of 02/10/2018  Medication Sig  . ALPRAZolam (XANAX) 0.25 MG tablet Take 1 tablet (0.25 mg total) by mouth daily as needed for anxiety.  . [DISCONTINUED] busPIRone (BUSPAR) 10 MG tablet TAKE 1TABLET (5 MG TOTAL) BY MOUTH DAILY AS NEEDED. (Patient not taking: Reported on 08/07/2017)  . [DISCONTINUED] busPIRone (BUSPAR) 5 MG tablet TAKE 1 TABLET BY MOUTH DAILY AS NEEDED (Patient not taking: Reported on 08/07/2017)   No facility-administered encounter medications on file as of 02/10/2018.     Review of Systems  Constitutional: Negative for appetite change and unexpected weight change.  HENT: Negative for congestion and sinus pressure.   Respiratory: Negative for cough, chest tightness and shortness of breath.   Cardiovascular: Negative for chest pain, palpitations and leg swelling.  Gastrointestinal: Negative for abdominal pain, diarrhea, nausea and vomiting.  Genitourinary: Negative for difficulty urinating and dysuria.  Musculoskeletal: Negative for joint swelling and myalgias.  Skin: Negative for color change and rash.  Neurological: Negative for dizziness, light-headedness and headaches.  Psychiatric/Behavioral: Negative for agitation and dysphoric mood.       Objective:    Physical Exam  Constitutional: She appears well-developed and well-nourished. No distress.  HENT:  Nose: Nose normal.  Mouth/Throat: Oropharynx is clear and moist.  Neck: Neck supple.  Thyroid fullness.    Cardiovascular: Normal rate and regular rhythm.  Pulmonary/Chest: Breath sounds normal. No respiratory distress. She has no wheezes.  Abdominal: Soft. Bowel sounds are normal. There is no tenderness.  Genitourinary:  Genitourinary Comments: Normal external genitalia.  Vaginal vault without lesions.  Cervix identified.  Pap smear performed.  Could not appreciate any adnexal masses or tenderness.     Musculoskeletal: She exhibits no edema or tenderness.  Lymphadenopathy:    She has no cervical adenopathy.  Skin: No rash noted. No erythema.  Psychiatric: She has a normal mood and affect. Her behavior is normal.    BP 102/62 (BP Location: Left Arm, Patient Position: Sitting, Cuff Size: Normal)   Pulse 93   Temp 98.6 F (37 C) (Oral)   Resp 18   Wt 134 lb (60.8 kg)   SpO2 98%   BMI 23.45 kg/m  Wt Readings from Last 3 Encounters:  02/10/18 134 lb (60.8 kg)  08/07/17 135 lb 9.6 oz (61.5 kg)  06/27/17 136 lb 9.6 oz (62 kg)     Lab Results  Component Value Date   WBC 6.8 06/25/2017   HGB 14.4 06/25/2017   HCT 42.8 06/25/2017   PLT 185.0 06/25/2017   GLUCOSE 98 06/25/2017   CHOL 178 06/25/2017   TRIG 108.0 06/25/2017   HDL 51.00 06/25/2017   LDLCALC 105 (H) 06/25/2017   ALT 11 06/25/2017   AST 14 06/25/2017   NA 138 06/25/2017   K 4.1 06/25/2017   CL 105 06/25/2017   CREATININE 0.80 06/25/2017   BUN 12 06/25/2017   CO2 27 06/25/2017   TSH 3.67 06/25/2017       Assessment & Plan:   Problem List Items Addressed This Visit    Anxiety    Doing better.  Takes xanax prn.  Follow.        Cervical high risk HPV (human papillomavirus) test positive    Repeat pap today.        Thyroid fullness    Schedule thyroid ultrasound.         Other Visit Diagnoses    Cervical cancer screening    -  Primary   Relevant Orders   Cytology - PAP (Completed)       Dale [place], MD

## 2018-02-12 LAB — CYTOLOGY - PAP
Diagnosis: NEGATIVE
HPV: NOT DETECTED

## 2018-02-15 ENCOUNTER — Encounter: Payer: Self-pay | Admitting: Internal Medicine

## 2018-02-15 DIAGNOSIS — E0789 Other specified disorders of thyroid: Secondary | ICD-10-CM | POA: Insufficient documentation

## 2018-02-15 DIAGNOSIS — R87810 Cervical high risk human papillomavirus (HPV) DNA test positive: Secondary | ICD-10-CM | POA: Insufficient documentation

## 2018-02-15 NOTE — Assessment & Plan Note (Signed)
Schedule thyroid ultrasound

## 2018-02-15 NOTE — Assessment & Plan Note (Signed)
Repeat pap today

## 2018-02-15 NOTE — Assessment & Plan Note (Signed)
Doing better.  Takes xanax prn.  Follow.

## 2018-02-19 ENCOUNTER — Telehealth: Payer: Self-pay

## 2018-02-19 NOTE — Telephone Encounter (Signed)
Copied from CRM [phone]#[id number]. Topic: General - Other >> Feb 19, 2018  2:54 PM Gerrianne ScalePayne, Angela L wrote: Reason for CRM: pt calling stating that someone had called her from the office

## 2018-02-20 NOTE — Telephone Encounter (Signed)
See result note.  

## 2018-02-21 ENCOUNTER — Telehealth: Payer: Self-pay | Admitting: Internal Medicine

## 2018-02-21 NOTE — Telephone Encounter (Signed)
See result note.  

## 2018-02-21 NOTE — Telephone Encounter (Signed)
Pt returned call  Copied from CRM [id number]. Topic: Quick Communication - Lab Results >> Feb 21, 2018  3:58 PM Larry Sierrasavis, Trisha L, LPN wrote: Called patient to inform them of lab results. When patient returns call, triage nurse may disclose results.

## 2018-04-02 ENCOUNTER — Other Ambulatory Visit: Payer: Self-pay | Admitting: Internal Medicine

## 2018-07-01 ENCOUNTER — Encounter: Payer: Self-pay | Admitting: Internal Medicine

## 2018-07-01 ENCOUNTER — Ambulatory Visit (INDEPENDENT_AMBULATORY_CARE_PROVIDER_SITE_OTHER): Payer: 59 | Admitting: Internal Medicine

## 2018-07-01 VITALS — BP 98/60 | HR 102 | Temp 98.1°F | Wt 135.2 lb

## 2018-07-01 DIAGNOSIS — R87810 Cervical high risk human papillomavirus (HPV) DNA test positive: Secondary | ICD-10-CM

## 2018-07-01 DIAGNOSIS — E0789 Other specified disorders of thyroid: Secondary | ICD-10-CM

## 2018-07-01 DIAGNOSIS — Z0000 Encounter for general adult medical examination without abnormal findings: Secondary | ICD-10-CM

## 2018-07-01 DIAGNOSIS — F419 Anxiety disorder, unspecified: Secondary | ICD-10-CM | POA: Diagnosis not present

## 2018-07-01 NOTE — Assessment & Plan Note (Signed)
Physical today 07/01/18.  PAP 02/2018 negative with negative HPV.  Recommended f/u in one year.

## 2018-07-02 MED ORDER — ALPRAZOLAM 0.25 MG PO TABS: 0.2500 mg | ORAL_TABLET | Freq: Every day | ORAL | 0 refills | Status: DC | PRN

## 2018-07-02 NOTE — Assessment & Plan Note (Signed)
Persistent fullness on exam.  Discussed thyroid ultrasound.  She is in agreement.  Will notify me of place the wants this scheduled.

## 2018-07-02 NOTE — Assessment & Plan Note (Signed)
Had repeat pap 02/2018.  Recommended f/u in one year.  HPV negative.

## 2018-07-02 NOTE — Addendum Note (Signed)
Addended by: Charm Barges on: 07/02/2018 12:20 AM   Modules accepted: Orders

## 2018-07-02 NOTE — Assessment & Plan Note (Signed)
Takes xanax prn.  Overall she feels she is doing relatively well.  Follow.

## 2018-07-02 NOTE — Progress Notes (Addendum)
Patient ID: Joanna Nixon, female   DOB: 10-07-1985, 33 y.o.   MRN: [id number]   Subjective:    Patient ID: Joanna Nixon, female    DOB: 10-07-1985, 33 y.o.   MRN: [id number]  HPI  Patient here for a physical exam.  She reports she is doing well.  Feels good.  Tries to stay active.  No chest pain.  No sob.  No acid reflux.  No abdominal pain.  Bowels moving.  No urine change.  Periods regular.  Not heavy.  Lasts 3-5 days.     History reviewed. No pertinent past medical history. Past Surgical History:  Procedure Laterality Date  . NO PAST SURGERIES     Family History  Problem Relation Age of Onset  . Hyperlipidemia Mother   . Heart attack Mother        09/2015  . Hyperlipidemia Father    Social History   Socioeconomic History  . Marital status: Married    Spouse name: Not on file  . Number of children: Not on file  . Years of education: Not on file  . Highest education level: Not on file  Occupational History  . Not on file  Social Needs  . Financial resource strain: Not on file  . Food insecurity:    Worry: Not on file    Inability: Not on file  . Transportation needs:    Medical: Not on file    Non-medical: Not on file  Tobacco Use  . Smoking status: Never Smoker  . Smokeless tobacco: Never Used  Substance and Sexual Activity  . Alcohol use: Yes    Comment: 3 days per week  . Drug use: No  . Sexual activity: Yes    Partners: Male    Birth control/protection: None  Lifestyle  . Physical activity:    Days per week: Not on file    Minutes per session: Not on file  . Stress: Not on file  Relationships  . Social connections:    Talks on phone: Not on file    Gets together: Not on file    Attends religious service: Not on file    Active member of club or organization: Not on file    Attends meetings of clubs or organizations: Not on file    Relationship status: Not on file  Other Topics Concern  . Not on file  Social History Narrative  . Not on file     Outpatient Encounter Medications as of 07/01/2018  Medication Sig  . ALPRAZolam (XANAX) 0.25 MG tablet Take 1 tablet (0.25 mg total) by mouth daily as needed. for anxiety  . [DISCONTINUED] ALPRAZolam (XANAX) 0.25 MG tablet TAKE 1 TABLET BY MOUTH DAILY AS NEEDED FOR ANXIETY   No facility-administered encounter medications on file as of 07/01/2018.     Review of Systems  Constitutional: Negative for appetite change and unexpected weight change.  HENT: Negative for congestion and sinus pressure.   Eyes: Negative for pain and visual disturbance.  Respiratory: Negative for cough, chest tightness and shortness of breath.   Cardiovascular: Negative for chest pain, palpitations and leg swelling.  Gastrointestinal: Negative for abdominal pain, diarrhea, nausea and vomiting.  Genitourinary: Negative for difficulty urinating and dysuria.  Musculoskeletal: Negative for joint swelling and myalgias.  Skin: Negative for color change and rash.  Neurological: Negative for dizziness, light-headedness and headaches.  Hematological: Negative for adenopathy. Does not bruise/bleed easily.  Psychiatric/Behavioral: Negative for agitation and dysphoric mood.  Objective:    Physical Exam Constitutional:      General: She is not in acute distress.    Appearance: Normal appearance. She is well-developed.  HENT:     Nose: Nose normal. No congestion.     Mouth/Throat:     Pharynx: No oropharyngeal exudate or posterior oropharyngeal erythema.  Eyes:     General: No scleral icterus.       Right eye: No discharge.        Left eye: No discharge.  Neck:     Musculoskeletal: Neck supple. No muscular tenderness.     Thyroid: No thyromegaly.  Cardiovascular:     Rate and Rhythm: Normal rate and regular rhythm.  Pulmonary:     Effort: No tachypnea, accessory muscle usage or respiratory distress.     Breath sounds: Normal breath sounds. No decreased breath sounds or wheezing.  Chest:     Breasts:         Right: No inverted nipple, mass, nipple discharge or tenderness (no axillary adenopathy).        Left: No inverted nipple, mass, nipple discharge or tenderness (no axilarry adenopathy).  Abdominal:     General: Bowel sounds are normal.     Palpations: Abdomen is soft.     Tenderness: There is no abdominal tenderness.  Musculoskeletal:        General: No swelling or tenderness.  Lymphadenopathy:     Cervical: No cervical adenopathy.  Skin:    Findings: No erythema or rash.  Neurological:     Mental Status: She is alert and oriented to person, place, and time.  Psychiatric:        Mood and Affect: Mood normal.        Behavior: Behavior normal.     BP 98/60 (BP Location: Left Arm, Patient Position: Sitting, Cuff Size: Normal)   Pulse (!) 102   Temp 98.1 F (36.7 C)   Wt 135 lb 3.2 oz (61.3 kg)   SpO2 98%   BMI 23.66 kg/m  Wt Readings from Last 3 Encounters:  07/01/18 135 lb 3.2 oz (61.3 kg)  02/10/18 134 lb (60.8 kg)  08/07/17 135 lb 9.6 oz (61.5 kg)     Lab Results  Component Value Date   WBC 6.8 06/25/2017   HGB 14.4 06/25/2017   HCT 42.8 06/25/2017   PLT 185.0 06/25/2017   GLUCOSE 98 06/25/2017   CHOL 178 06/25/2017   TRIG 108.0 06/25/2017   HDL 51.00 06/25/2017   LDLCALC 105 (H) 06/25/2017   ALT 11 06/25/2017   AST 14 06/25/2017   NA 138 06/25/2017   K 4.1 06/25/2017   CL 105 06/25/2017   CREATININE 0.80 06/25/2017   BUN 12 06/25/2017   CO2 27 06/25/2017   TSH 3.67 06/25/2017       Assessment & Plan:   Problem List Items Addressed This Visit    Anxiety    Takes xanax prn.  Overall she feels she is doing relatively well.  Follow.        Relevant Medications   ALPRAZolam (XANAX) 0.25 MG tablet   Cervical high risk HPV (human papillomavirus) test positive    Had repeat pap 02/2018.  Recommended f/u in one year.  HPV negative.        Healthcare maintenance    Physical today 07/01/18.  PAP 02/2018 negative with negative HPV.  Recommended f/u in one  year.        Thyroid fullness    Persistent fullness  on exam.  Discussed thyroid ultrasound.  She is in agreement.  Will notify me of place the wants this scheduled.         Other Visit Diagnoses    Routine general medical examination at a health care facility    -  Primary       Dale [place], MD

## 2018-07-03 ENCOUNTER — Telehealth: Payer: Self-pay | Admitting: Internal Medicine

## 2018-07-03 DIAGNOSIS — E0789 Other specified disorders of thyroid: Secondary | ICD-10-CM

## 2018-07-03 NOTE — Telephone Encounter (Signed)
Copied from CRM [id number]. Topic: Quick Communication - See Telephone Encounter >> Jul 03, 2018 11:35 AM Lorrine Kin, NT wrote: CRM for notification. See Telephone encounter for: 07/03/18. Patietn calling and states that at her visit with Dr Lorin Picket on 07/01/2018 there was mention of doing a thyroid ultrasound. Patient would like to do this at [facility]. States that if it's done at the outpatient imaging center on [address], that would be a better location due to it being by her office. If they do not do thyroid ultrasounds there, she would like to go to [facility]. Please advise.

## 2018-07-03 NOTE — Telephone Encounter (Signed)
I have placed the order for the thyroid ultrasound.  Per her note, I stated in the instructions that she wanted at the imaging center [address].  Confirm this is correct.

## 2018-07-03 NOTE — Telephone Encounter (Signed)
FYI pt would like to have US done at imaging center if possible, if not [facility] will be okay

## 2018-07-04 NOTE — Telephone Encounter (Signed)
Patient confirmed imaging center on [address]. Patient aware referral is being made.

## 2018-07-28 ENCOUNTER — Ambulatory Visit
Admission: RE | Admit: 2018-07-28 | Discharge: 2018-07-28 | Disposition: A | Discharge status: Home / Self Care | Payer: 59 | Source: Ambulatory Visit | Attending: Internal Medicine | Admitting: Internal Medicine

## 2018-07-28 DIAGNOSIS — E0789 Other specified disorders of thyroid: Secondary | ICD-10-CM | POA: Diagnosis present

## 2018-08-01 ENCOUNTER — Other Ambulatory Visit: Payer: Self-pay | Admitting: Internal Medicine

## 2018-08-01 DIAGNOSIS — E041 Nontoxic single thyroid nodule: Secondary | ICD-10-CM

## 2018-08-01 DIAGNOSIS — E049 Nontoxic goiter, unspecified: Secondary | ICD-10-CM

## 2018-08-01 NOTE — Progress Notes (Signed)
Order placed for endocrinology referral.  

## 2018-11-05 ENCOUNTER — Other Ambulatory Visit: Payer: Self-pay | Admitting: Internal Medicine

## 2018-11-07 NOTE — Telephone Encounter (Signed)
Last OV 07/01/18 Next OV none Last refill 07/02/2018

## 2019-04-29 ENCOUNTER — Other Ambulatory Visit: Payer: Self-pay

## 2019-04-29 NOTE — Progress Notes (Signed)
Last OV 07/01/18 Next OV 07/03/19  Last refill 11/08/18

## 2019-04-30 MED ORDER — ALPRAZOLAM 0.25 MG PO TABS: 0.2500 mg | ORAL_TABLET | Freq: Every day | ORAL | 0 refills | Status: DC | PRN

## 2019-04-30 NOTE — Progress Notes (Signed)
rx sent in for xanax #30 with no refills.   

## 2019-07-01 ENCOUNTER — Telehealth: Payer: Self-pay | Admitting: Internal Medicine

## 2019-07-01 NOTE — Telephone Encounter (Signed)
LMTCB and set up doxy appt with no return call  appt is scheduled for 07/03/19

## 2019-07-01 NOTE — Telephone Encounter (Signed)
Left message for patient. Per chart, pt cancelled appt and rescheduled for February

## 2019-07-03 ENCOUNTER — Encounter: Payer: 59 | Admitting: Internal Medicine

## 2019-07-28 ENCOUNTER — Encounter: Payer: 59 | Admitting: Internal Medicine

## 2019-08-10 ENCOUNTER — Ambulatory Visit (INDEPENDENT_AMBULATORY_CARE_PROVIDER_SITE_OTHER): Payer: 59 | Admitting: Internal Medicine

## 2019-08-10 ENCOUNTER — Encounter: Payer: Self-pay | Admitting: Internal Medicine

## 2019-08-10 ENCOUNTER — Other Ambulatory Visit: Payer: Self-pay

## 2019-08-10 VITALS — BP 106/68 | HR 78 | Temp 98.0°F | Resp 16 | Ht 63.0 in | Wt 134.2 lb

## 2019-08-10 DIAGNOSIS — R87810 Cervical high risk human papillomavirus (HPV) DNA test positive: Secondary | ICD-10-CM

## 2019-08-10 DIAGNOSIS — Z0000 Encounter for general adult medical examination without abnormal findings: Secondary | ICD-10-CM

## 2019-08-10 DIAGNOSIS — E0789 Other specified disorders of thyroid: Secondary | ICD-10-CM

## 2019-08-10 DIAGNOSIS — F419 Anxiety disorder, unspecified: Secondary | ICD-10-CM

## 2019-08-10 NOTE — Progress Notes (Signed)
Patient ID: Joanna Nixon, female   DOB: 04-04-1986, 34 y.o.   MRN: [id number]   Subjective:    Patient ID: Joanna Nixon, female    DOB: 1985-06-21, 34 y.o.   MRN: [id number]  HPI  Patient here for her physical exam.  She reports she is doing relatively well.  Tries to stay active.  No chest pain or sob reported.  No acid reflux.  No abdominal pain.  Bowels moving.  Increased stress with mother's health issues, etc.  Request refill xanax.  Has increased water intake.  Starting to exercise.  Periods regular.    History reviewed. No pertinent past medical history. Past Surgical History:  Procedure Laterality Date  . NO PAST SURGERIES     Family History  Problem Relation Age of Onset  . Hyperlipidemia Mother   . Heart attack Mother        09/2015  . Hyperlipidemia Father    Social History   Socioeconomic History  . Marital status: Married    Spouse name: Not on file  . Number of children: Not on file  . Years of education: Not on file  . Highest education level: Not on file  Occupational History  . Not on file  Tobacco Use  . Smoking status: Never Smoker  . Smokeless tobacco: Never Used  Substance and Sexual Activity  . Alcohol use: Yes    Comment: 3 days per week  . Drug use: No  . Sexual activity: Yes    Partners: Male    Birth control/protection: None  Other Topics Concern  . Not on file  Social History Narrative  . Not on file   Social Determinants of Health   Financial Resource Strain:   . Difficulty of Paying Living Expenses:   Food Insecurity:   . Worried About Programme researcher, broadcasting/film/video in the Last Year:   . Barista in the Last Year:   Transportation Needs:   . Freight forwarder (Medical):   Marland Kitchen Lack of Transportation (Non-Medical):   Physical Activity:   . Days of Exercise per Week:   . Minutes of Exercise per Session:   Stress:   . Feeling of Stress :   Social Connections:   . Frequency of Communication with Friends and Family:   . Frequency  of Social Gatherings with Friends and Family:   . Attends Religious Services:   . Active Member of Clubs or Organizations:   . Attends Banker Meetings:   Marland Kitchen Marital Status:     Outpatient Encounter Medications as of 08/10/2019  Medication Sig  . ALPRAZolam (XANAX) 0.25 MG tablet Take 1 tablet (0.25 mg total) by mouth daily as needed. for anxiety  . [DISCONTINUED] ALPRAZolam (XANAX) 0.25 MG tablet Take 1 tablet (0.25 mg total) by mouth daily as needed. for anxiety   No facility-administered encounter medications on file as of 08/10/2019.   Review of Systems  Constitutional: Negative for appetite change and unexpected weight change.  HENT: Negative for congestion and sinus pressure.   Eyes: Negative for pain and visual disturbance.  Respiratory: Negative for cough, chest tightness and shortness of breath.   Cardiovascular: Negative for chest pain, palpitations and leg swelling.  Gastrointestinal: Negative for abdominal pain, diarrhea, nausea and vomiting.  Genitourinary: Negative for difficulty urinating and dysuria.  Musculoskeletal: Negative for joint swelling and myalgias.  Skin: Negative for color change and rash.  Neurological: Negative for dizziness, light-headedness and headaches.  Hematological: Negative for adenopathy.  Does not bruise/bleed easily.  Psychiatric/Behavioral: Negative for agitation and dysphoric mood.       Objective:    Physical Exam Constitutional:      General: She is not in acute distress.    Appearance: Normal appearance. She is well-developed.  HENT:     Head: Normocephalic and atraumatic.  Eyes:     General: No scleral icterus.       Right eye: No discharge.        Left eye: No discharge.     Conjunctiva/sclera: Conjunctivae normal.  Neck:     Thyroid: No thyromegaly.  Cardiovascular:     Rate and Rhythm: Normal rate and regular rhythm.  Pulmonary:     Effort: No tachypnea, accessory muscle usage or respiratory distress.     Breath  sounds: Normal breath sounds. No decreased breath sounds or wheezing.  Chest:     Breasts:        Right: No inverted nipple, mass, nipple discharge or tenderness (no axillary adenopathy).        Left: No inverted nipple, mass, nipple discharge or tenderness (no axilarry adenopathy).  Abdominal:     General: Bowel sounds are normal.     Palpations: Abdomen is soft.     Tenderness: There is no abdominal tenderness.  Musculoskeletal:        General: No swelling or tenderness.     Cervical back: Neck supple. No tenderness.  Lymphadenopathy:     Cervical: No cervical adenopathy.  Skin:    Findings: No erythema or rash.  Neurological:     Mental Status: She is alert and oriented to person, place, and time.  Psychiatric:        Mood and Affect: Mood normal.        Behavior: Behavior normal.     BP 106/68   Pulse 78   Temp 98 F (36.7 C)   Resp 16   Ht 5\' 3"  (1.6 m)   Wt 134 lb 3.2 oz (60.9 kg)   SpO2 98%   BMI 23.77 kg/m  Wt Readings from Last 3 Encounters:  08/10/19 134 lb 3.2 oz (60.9 kg)  07/01/18 135 lb 3.2 oz (61.3 kg)  02/10/18 134 lb (60.8 kg)     Lab Results  Component Value Date   WBC 6.8 06/25/2017   HGB 14.4 06/25/2017   HCT 42.8 06/25/2017   PLT 185.0 06/25/2017   GLUCOSE 98 06/25/2017   CHOL 178 06/25/2017   TRIG 108.0 06/25/2017   HDL 51.00 06/25/2017   LDLCALC 105 (H) 06/25/2017   ALT 11 06/25/2017   AST 14 06/25/2017   NA 138 06/25/2017   K 4.1 06/25/2017   CL 105 06/25/2017   CREATININE 0.80 06/25/2017   BUN 12 06/25/2017   CO2 27 06/25/2017   TSH 3.67 06/25/2017    06/27/2017 THYROID  Result Date: 07/28/2018 CLINICAL DATA:  Palpable abnormality. 34 year old female with thyroid fullness on physical exam EXAM: THYROID ULTRASOUND TECHNIQUE: Ultrasound examination of the thyroid gland and adjacent soft tissues was performed. COMPARISON:  None. FINDINGS: Parenchymal Echotexture: Mildly heterogenous Isthmus: 0.2 cm Right lobe: 5.2 x 1.4 x 1.8 cm Left  lobe: 3.9 x 1.1 x 1.3 cm _________________________________________________________ Estimated total number of nodules >/= 1 cm: 1 Number of spongiform nodules >/=  2 cm not described below (TR1): 0 Number of mixed cystic and solid nodules >/= 1.5 cm not described below (TR2): 0 _________________________________________________________ Nodule # 1: Location: Right; Inferior Maximum size: 1.2 cm; Other  2 dimensions: 1.0 x 0.6 cm Composition: mixed cystic and solid (1) Echogenicity: isoechoic (1) Shape: not taller-than-wide (0) Margins: smooth (0) Echogenic foci: none (0) ACR TI-RADS total points: 2. ACR TI-RADS risk category: TR2 (2 points). ACR TI-RADS recommendations: This nodule does NOT meet TI-RADS criteria for biopsy or dedicated follow-up. _________________________________________________________ IMPRESSION: Mildly heterogeneous and borderline enlarged thyroid gland. Incidental note is made of a small 1.2 cm TI-RADS category 2 nodule in the medial right lower gland. This nodule does not require further evaluation or follow-up. The above is in keeping with the ACR TI-RADS recommendations - J Am Coll Radiol 2017;14:587-595. Electronically Signed   By: Jacqulynn Cadet M.D.   On: 07/28/2018 11:28       Assessment & Plan:   Problem List Items Addressed This Visit    Anxiety    Increased stress as outlined.  Request to have rx for xanax to take prn.  Rarely uses.  Follow.        Relevant Medications   ALPRAZolam (XANAX) 0.25 MG tablet   Other Relevant Orders   CBC with Differential/Platelet   Comprehensive metabolic panel   Lipid panel   Cervical high risk HPV (human papillomavirus) test positive    Had repeat pap 02/2018.  Discussed pap today.  She wanted to hold until next visit.  Follow.        Healthcare maintenance    Physical today 08/07/19.  PAP 02/2018 -negative with negative HPV.  Recommended f/u in one year. Discussed pap today.  Wanted to hold until next visit.  Discussed mammogram.          Thyroid fullness    Saw endocrinology - no further w/up warranted.  Follow tsh.       Relevant Orders   TSH       Einar Pheasant, MD

## 2019-08-16 ENCOUNTER — Encounter: Payer: Self-pay | Admitting: Internal Medicine

## 2019-08-16 MED ORDER — ALPRAZOLAM 0.25 MG PO TABS: 0.2500 mg | ORAL_TABLET | Freq: Every day | ORAL | 0 refills | Status: DC | PRN

## 2019-08-16 NOTE — Assessment & Plan Note (Signed)
Had repeat pap 02/2018.  Discussed pap today.  She wanted to hold until next visit.  Follow.

## 2019-08-16 NOTE — Assessment & Plan Note (Addendum)
Saw endocrinology - no further w/up warranted.  Follow tsh.

## 2019-08-16 NOTE — Assessment & Plan Note (Signed)
Increased stress as outlined.  Request to have rx for xanax to take prn.  Rarely uses.  Follow.

## 2019-08-16 NOTE — Assessment & Plan Note (Signed)
Physical today 08/07/19.  PAP 02/2018 -negative with negative HPV.  Recommended f/u in one year. Discussed pap today.  Wanted to hold until next visit.  Discussed mammogram.

## 2019-10-31 IMAGING — US US THYROID
1 series · 13 of 25 positions shown · non-contrast
Comparison: None.

CLINICAL DATA: Palpable abnormality. 32-year-old female with
thyroid fullness on physical exam

EXAM:
THYROID ULTRASOUND
TECHNIQUE: Ultrasound examination of the thyroid gland and adjacent soft
tissues was performed.

[Series 1: us thyroid · 0.06mm/px · 13 of 44 slices shown]
[im 1/44]
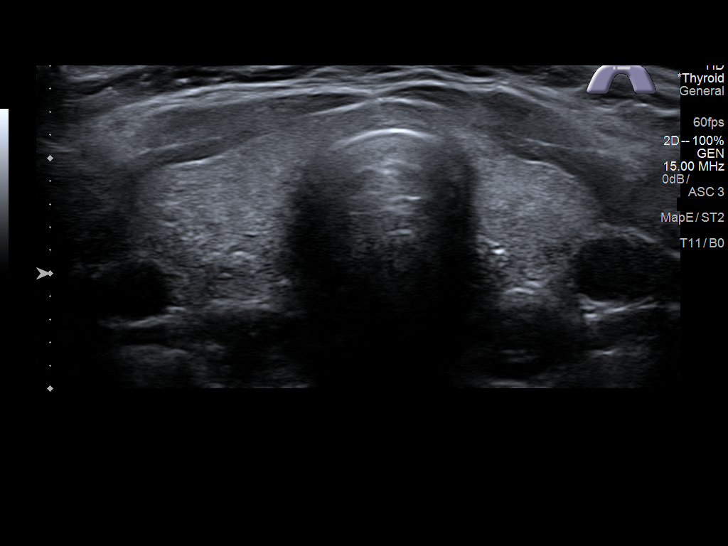
[im 4/44]
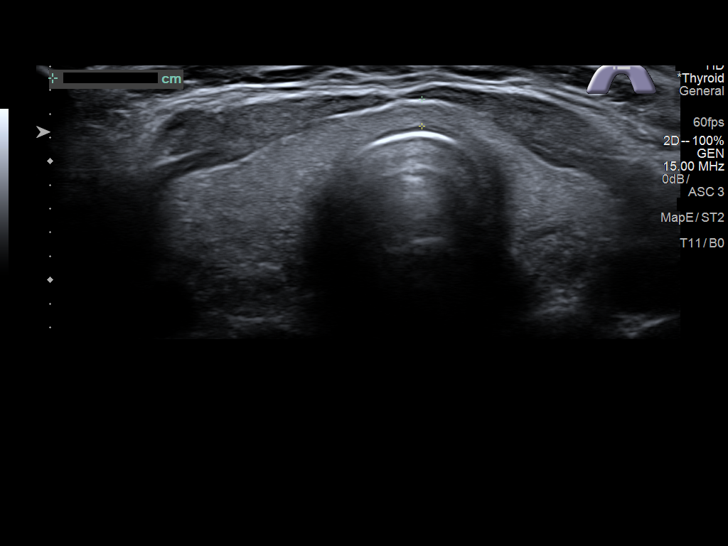
[im 8/44]
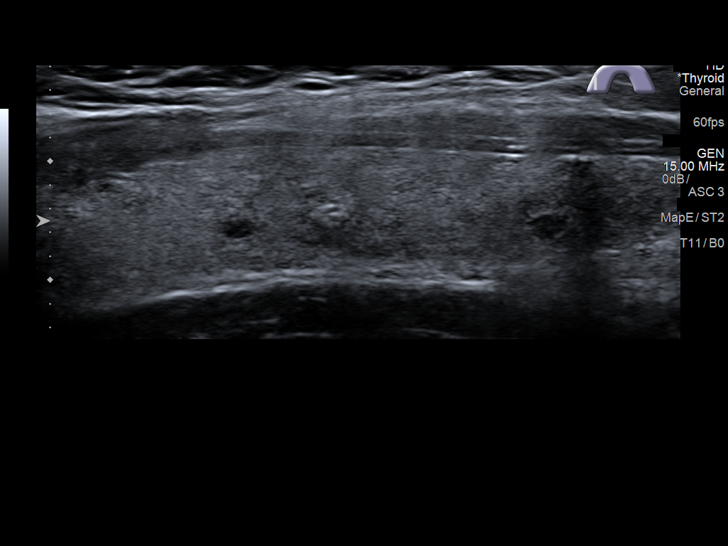
[im 11/44]
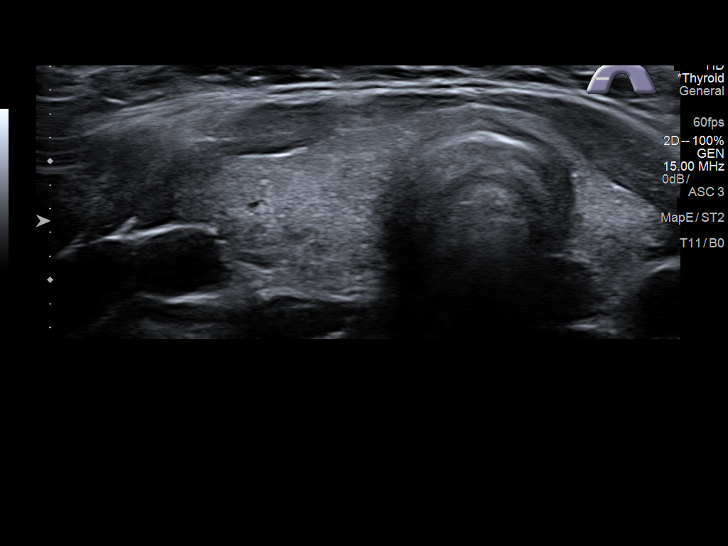
[im 15/44]
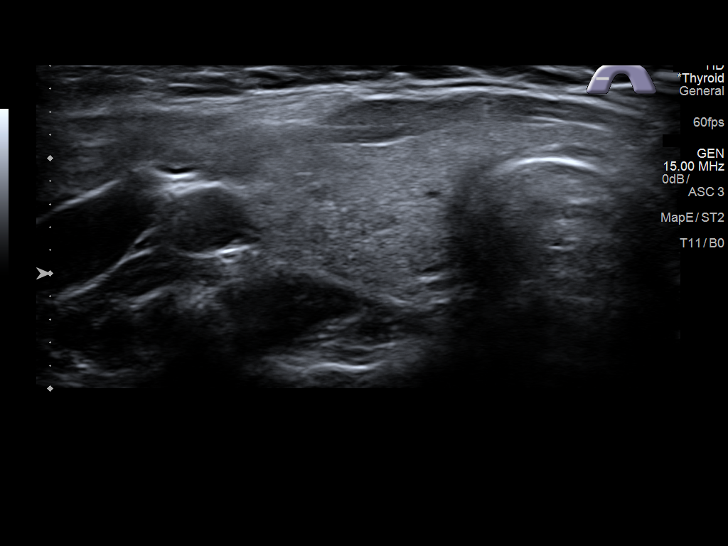
[im 18/44]
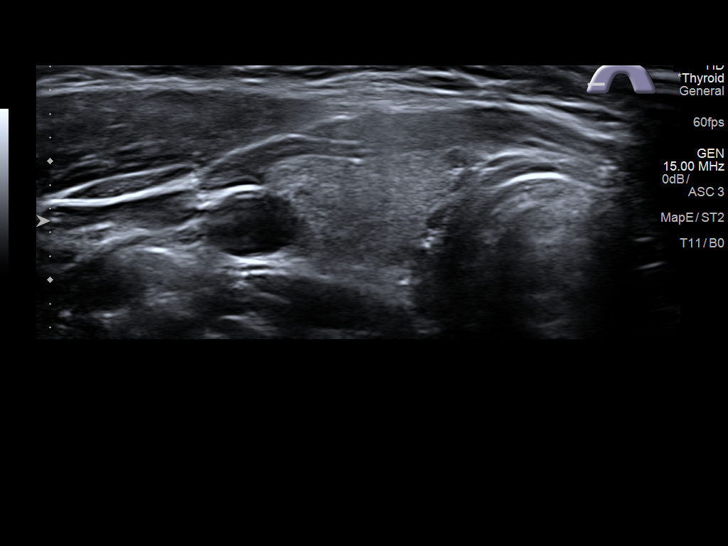
[im 22/44]
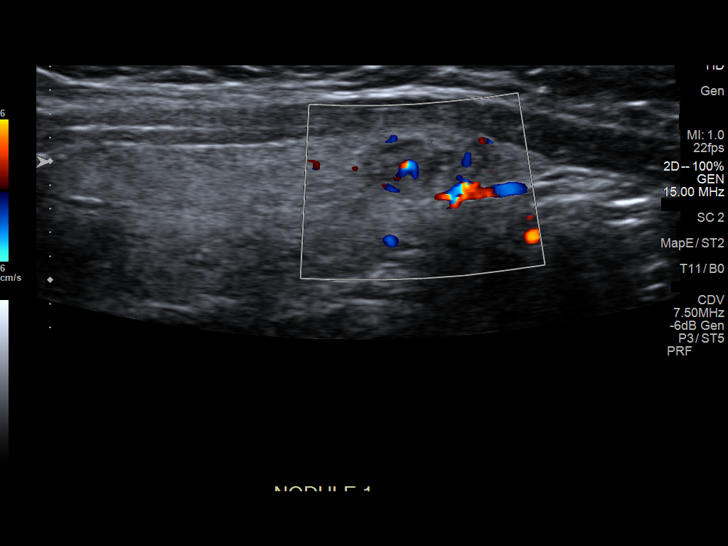
[im 26/44]
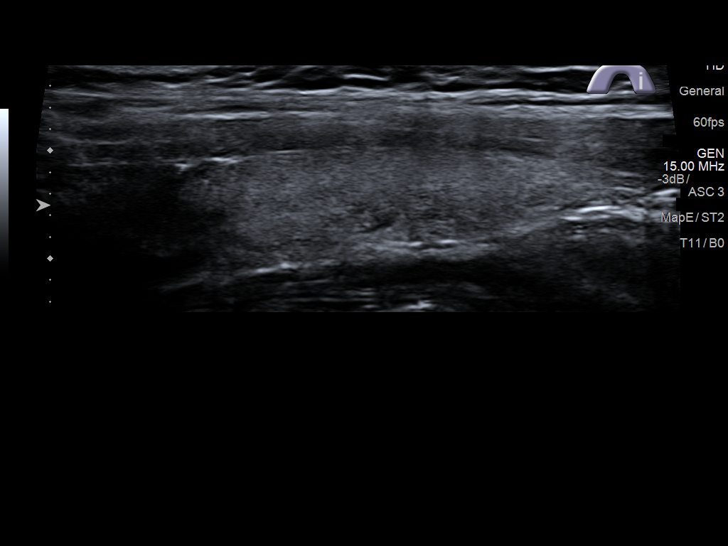
[im 29/44]
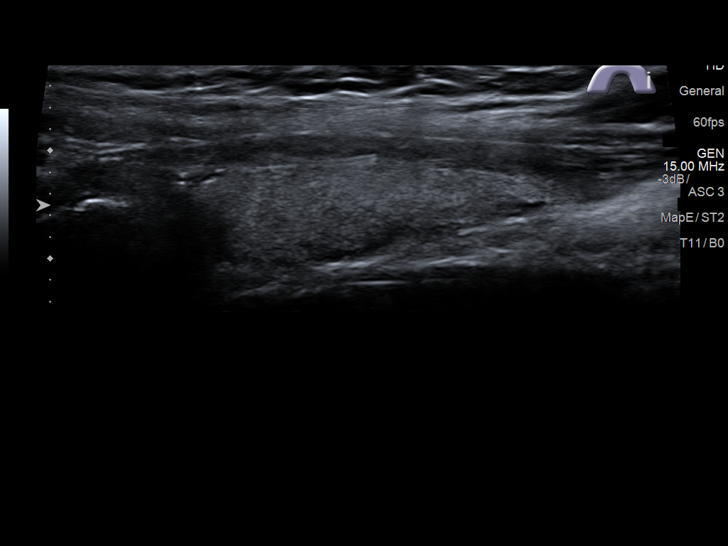
[im 33/44]
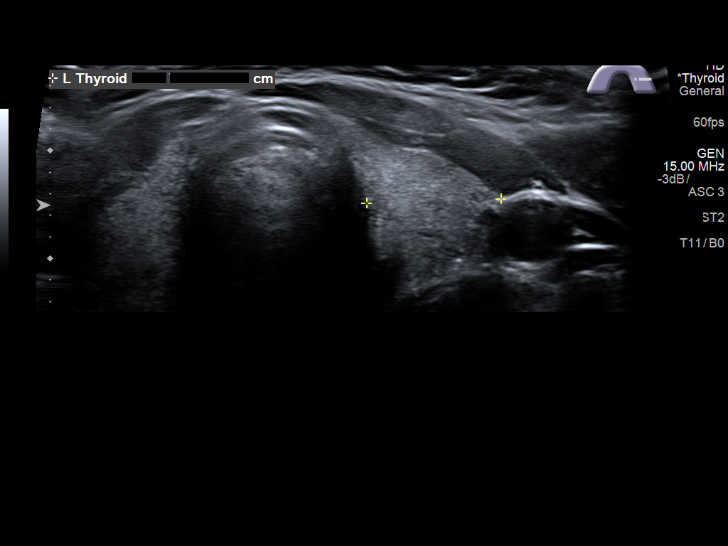
[im 36/44]
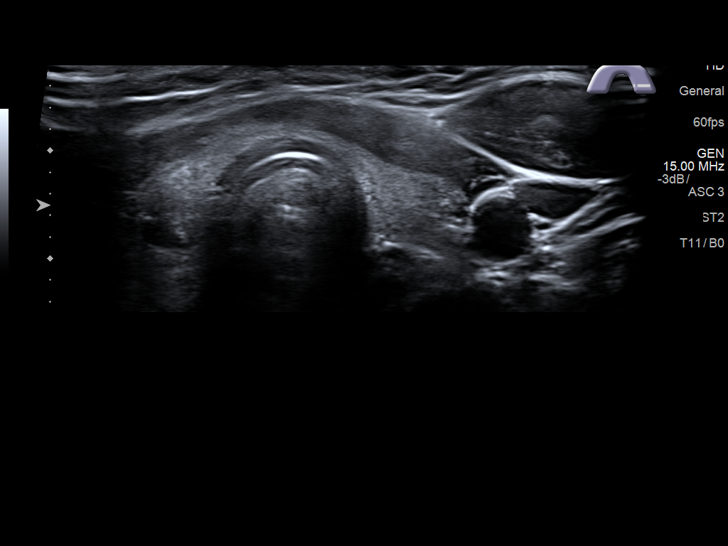
[im 40/44]
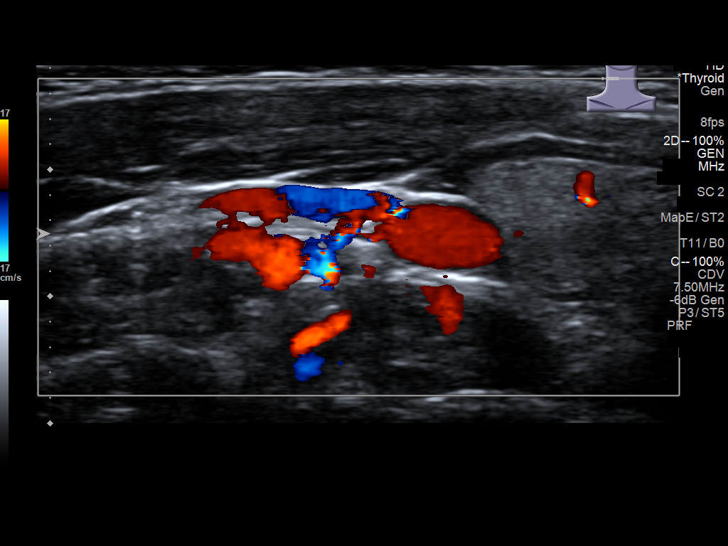
[im 44/44]
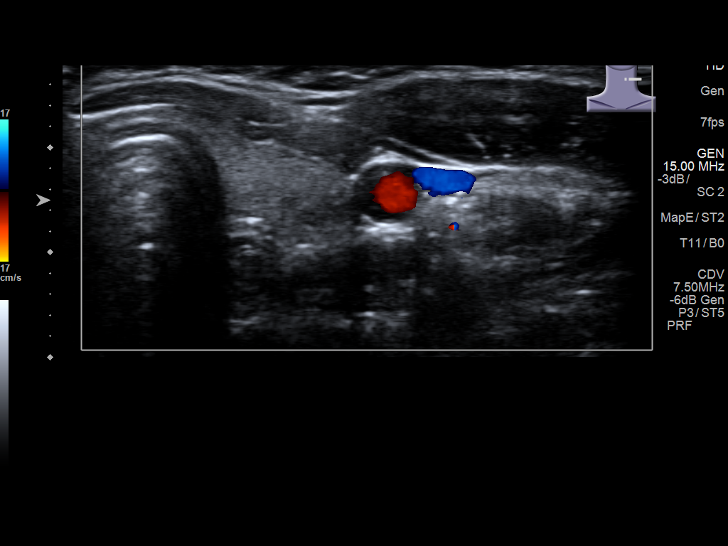

[13 of 25 positions shown; findings below may reference images not displayed]

FINDINGS: Parenchymal Echotexture: Mildly heterogenous

Isthmus: 0.2 cm

Right lobe: 5.2 x 1.4 x 1.8 cm

Left lobe: 3.9 x 1.1 x 1.3 cm

_________________________________________________________

Estimated total number of nodules >/= 1 cm: 1

Number of spongiform nodules >/=  2 cm not described below (TR1): 0

Number of mixed cystic and solid nodules >/= 1.5 cm not described
below (TR2): 0

_________________________________________________________

Nodule # 1:

Location: Right; Inferior

Maximum size: 1.2 cm; Other 2 dimensions: 1.0 x 0.6 cm

Composition: mixed cystic and solid (1)

Echogenicity: isoechoic (1)

Shape: not taller-than-wide (0)

Margins: smooth (0)

Echogenic foci: none (0)

ACR TI-RADS total points: 2.

ACR TI-RADS risk category: TR2 (2 points).

ACR TI-RADS recommendations:

This nodule does NOT meet TI-RADS criteria for biopsy or dedicated
follow-up.

_________________________________________________________
IMPRESSION: Mildly heterogeneous and borderline enlarged thyroid gland.

Incidental note is made of a small 1.2 cm TI-RADS category 2 nodule
in the medial right lower gland. This nodule does not require
further evaluation or follow-up.

The above is in keeping with the ACR TI-RADS recommendations - [HOSPITAL] 6014;[DATE].

## 2019-11-25 ENCOUNTER — Other Ambulatory Visit: Payer: Self-pay | Admitting: Internal Medicine

## 2019-11-25 NOTE — Telephone Encounter (Signed)
rx ok'd for xanax #30 with no refills.   

## 2019-11-25 NOTE — Telephone Encounter (Signed)
Refill request for xanax, last seen 08-10-19, last filled 08-16-19.  Please advise.

## 2020-02-09 ENCOUNTER — Other Ambulatory Visit (INDEPENDENT_AMBULATORY_CARE_PROVIDER_SITE_OTHER): Payer: 59

## 2020-02-09 ENCOUNTER — Other Ambulatory Visit: Payer: Self-pay

## 2020-02-09 DIAGNOSIS — F419 Anxiety disorder, unspecified: Secondary | ICD-10-CM

## 2020-02-09 DIAGNOSIS — E0789 Other specified disorders of thyroid: Secondary | ICD-10-CM

## 2020-02-09 LAB — COMPREHENSIVE METABOLIC PANEL
ALT: 12 U/L (ref 0–35)
AST: 16 U/L (ref 0–37)
Albumin: 4.5 g/dL (ref 3.5–5.2)
Alkaline Phosphatase: 42 U/L (ref 39–117)
BUN: 14 mg/dL (ref 6–23)
CO2: 27 mEq/L (ref 19–32)
Calcium: 8.9 mg/dL (ref 8.4–10.5)
Chloride: 102 mEq/L (ref 96–112)
Creatinine, Ser: 0.76 mg/dL (ref 0.40–1.20)
GFR: 86.13 mL/min (ref >60.00)
Glucose, Bld: 99 mg/dL (ref 70–99)
Potassium: 3.8 mEq/L (ref 3.5–5.1)
Sodium: 137 mEq/L (ref 135–145)
Total Bilirubin: 0.4 mg/dL (ref 0.2–1.2)
Total Protein: 7.1 g/dL (ref 6.0–8.3)

## 2020-02-09 LAB — CBC WITH DIFFERENTIAL/PLATELET
Basophils Absolute: 0 K/uL (ref 0.0–0.1)
Basophils Relative: 0.3 % (ref 0.0–3.0)
Eosinophils Absolute: 0.1 K/uL (ref 0.0–0.7)
Eosinophils Relative: 1.8 % (ref 0.0–5.0)
HCT: 41.4 % (ref 36.0–46.0)
Hemoglobin: 14 g/dL (ref 12.0–15.0)
Lymphocytes Relative: 31.3 % (ref 12.0–46.0)
Lymphs Abs: 2 K/uL (ref 0.7–4.0)
MCHC: 33.7 g/dL (ref 30.0–36.0)
MCV: 90 fl (ref 78.0–100.0)
Monocytes Absolute: 0.4 K/uL (ref 0.1–1.0)
Monocytes Relative: 6.6 % (ref 3.0–12.0)
Neutro Abs: 3.8 K/uL (ref 1.4–7.7)
Neutrophils Relative %: 60 % (ref 43.0–77.0)
Platelets: 195 K/uL (ref 150.0–400.0)
RBC: 4.61 Mil/uL (ref 3.87–5.11)
RDW: 12.7 % (ref 11.5–15.5)
WBC: 6.3 K/uL (ref 4.0–10.5)

## 2020-02-09 LAB — TSH: TSH: 4.78 u[IU]/mL — ABNORMAL HIGH (ref 0.35–4.50)

## 2020-02-09 LAB — LIPID PANEL
Cholesterol: 170 mg/dL (ref 0–200)
HDL: 60 mg/dL
LDL Cholesterol: 93 mg/dL (ref 0–99)
NonHDL: 109.64
Total CHOL/HDL Ratio: 3
Triglycerides: 85 mg/dL (ref 0.0–149.0)
VLDL: 17 mg/dL (ref 0.0–40.0)

## 2020-02-11 ENCOUNTER — Other Ambulatory Visit (HOSPITAL_COMMUNITY)
Admission: RE | Admit: 2020-02-11 | Discharge: 2020-02-11 | Disposition: A | Discharge status: Home / Self Care | Payer: 59 | Source: Ambulatory Visit | Attending: Internal Medicine | Admitting: Internal Medicine

## 2020-02-11 ENCOUNTER — Ambulatory Visit (INDEPENDENT_AMBULATORY_CARE_PROVIDER_SITE_OTHER): Payer: 59 | Admitting: Internal Medicine

## 2020-02-11 ENCOUNTER — Other Ambulatory Visit: Payer: Self-pay

## 2020-02-11 VITALS — BP 102/68 | HR 92 | Temp 98.1°F | Resp 16 | Ht 63.0 in | Wt 136.6 lb

## 2020-02-11 DIAGNOSIS — E0789 Other specified disorders of thyroid: Secondary | ICD-10-CM | POA: Diagnosis not present

## 2020-02-11 DIAGNOSIS — R87810 Cervical high risk human papillomavirus (HPV) DNA test positive: Secondary | ICD-10-CM

## 2020-02-11 DIAGNOSIS — F419 Anxiety disorder, unspecified: Secondary | ICD-10-CM

## 2020-02-11 DIAGNOSIS — Z124 Encounter for screening for malignant neoplasm of cervix: Secondary | ICD-10-CM

## 2020-02-11 DIAGNOSIS — R7989 Other specified abnormal findings of blood chemistry: Secondary | ICD-10-CM | POA: Diagnosis not present

## 2020-02-11 NOTE — Progress Notes (Addendum)
Patient ID: Joanna Nixon, female   DOB: Nov 25, 1985, 34 y.o.   MRN: [id number]   Subjective:    Patient ID: Joanna Nixon, female    DOB: [birth year], 34 y.o.   MRN: [id number]  HPI This visit occurred during the SARS-CoV-2 public health emergency.  Safety protocols were in place, including screening questions prior to the visit, additional usage of staff PPE, and extensive cleaning of exam room while observing appropriate contact time as indicated for disinfecting solutions.  Patient here for a scheduled follow up.  She reports she is doing relatively well. Appears to be handling stress well.  Trying to stay active. No chest pain or sob reported.  No acid reflux. No abdominal pain.  Bowels moving.  Overall feels things are stable.  LMP two weeks ago.  Regular periods.  Discussed labs.  Elevated tsh.    History reviewed. No pertinent past medical history. Past Surgical History:  Procedure Laterality Date   NO PAST SURGERIES     Family History  Problem Relation Age of Onset   Hyperlipidemia Mother    Heart attack Mother        09/2015   Hyperlipidemia Father    Social History   Socioeconomic History   Marital status: Married    Spouse name: Not on file   Number of children: Not on file   Years of education: Not on file   Highest education level: Not on file  Occupational History   Not on file  Tobacco Use   Smoking status: Never Smoker   Smokeless tobacco: Never Used  Substance and Sexual Activity   Alcohol use: Yes    Comment: 3 days per week   Drug use: No   Sexual activity: Yes    Partners: Male    Birth control/protection: None  Other Topics Concern   Not on file  Social History Narrative   Not on file   Social Determinants of Health   Financial Resource Strain:    Difficulty of Paying Living Expenses: Not on file  Food Insecurity:    Worried About Programme researcher, broadcasting/film/video in the Last Year: Not on file   [employer] of Food in the Last Year: Not on  file  Transportation Needs:    Lack of Transportation (Medical): Not on file   Lack of Transportation (Non-Medical): Not on file  Physical Activity:    Days of Exercise per Week: Not on file   Minutes of Exercise per Session: Not on file  Stress:    Feeling of Stress : Not on file  Social Connections:    Frequency of Communication with Friends and Family: Not on file   Frequency of Social Gatherings with Friends and Family: Not on file   Attends Religious Services: Not on file   Active Member of Clubs or Organizations: Not on file   Attends Banker Meetings: Not on file   Marital Status: Not on file    Outpatient Encounter Medications as of 02/11/2020  Medication Sig   [DISCONTINUED] ALPRAZolam (XANAX) 0.25 MG tablet TAKE 1 TABLET (0.25 MG TOTAL) BY MOUTH DAILY AS NEEDED. FOR ANXIETY   No facility-administered encounter medications on file as of 02/11/2020.   Review of Systems  Constitutional: Negative for appetite change and unexpected weight change.  HENT: Negative for congestion and sinus pressure.   Respiratory: Negative for cough, chest tightness and shortness of breath.   Cardiovascular: Negative for chest pain, palpitations and leg swelling.  Gastrointestinal: Negative  for abdominal pain, diarrhea, nausea and vomiting.  Genitourinary: Negative for difficulty urinating and dysuria.  Musculoskeletal: Negative for joint swelling and myalgias.  Skin: Negative for color change and rash.  Neurological: Negative for dizziness, light-headedness and headaches.  Psychiatric/Behavioral: Negative for agitation and dysphoric mood.       Objective:    Physical Exam Vitals reviewed.  Constitutional:      General: She is not in acute distress.    Appearance: Normal appearance.  HENT:     Head: Normocephalic and atraumatic.     Right Ear: External ear normal.     Left Ear: External ear normal.  Eyes:     General: No scleral icterus.       Right eye: No  discharge.        Left eye: No discharge.  Neck:     Thyroid: No thyromegaly.  Cardiovascular:     Rate and Rhythm: Normal rate and regular rhythm.  Pulmonary:     Effort: No respiratory distress.     Breath sounds: Normal breath sounds. No wheezing.  Abdominal:     General: Bowel sounds are normal.     Palpations: Abdomen is soft.     Tenderness: There is no abdominal tenderness.  Genitourinary:    Comments: Normal external genitalia.  Vaginal vault without lesions.  Cervix identified.  Pap smear performed.  Could not appreciate any adnexal masses or tenderness.   Musculoskeletal:        General: No swelling or tenderness.     Cervical back: Neck supple. No tenderness.  Lymphadenopathy:     Cervical: No cervical adenopathy.  Skin:    Findings: No erythema or rash.  Neurological:     Mental Status: She is alert.  Psychiatric:        Mood and Affect: Mood normal.     BP 102/68    Pulse 92    Temp 98.1 F (36.7 C) (Oral)    Resp 16    Ht 5\' 3"  (1.6 m)    Wt 136 lb 9.6 oz (62 kg)    SpO2 99%    BMI 24.20 kg/m  Wt Readings from Last 3 Encounters:  02/11/20 136 lb 9.6 oz (62 kg)  08/10/19 134 lb 3.2 oz (60.9 kg)  07/01/18 135 lb 3.2 oz (61.3 kg)     Lab Results  Component Value Date   WBC 6.3 02/09/2020   HGB 14.0 02/09/2020   HCT 41.4 02/09/2020   PLT 195.0 02/09/2020   GLUCOSE 99 02/09/2020   CHOL 170 02/09/2020   TRIG 85.0 02/09/2020   HDL 60.00 02/09/2020   LDLCALC 93 02/09/2020   ALT 12 02/09/2020   AST 16 02/09/2020   NA 137 02/09/2020   K 3.8 02/09/2020   CL 102 02/09/2020   CREATININE 0.76 02/09/2020   BUN 14 02/09/2020   CO2 27 02/09/2020   TSH 4.78 (H) 02/09/2020    04/10/2020 THYROID  Result Date: 07/28/2018 CLINICAL DATA:  Palpable abnormality. 34 year old female with thyroid fullness on physical exam EXAM: THYROID ULTRASOUND TECHNIQUE: Ultrasound examination of the thyroid gland and adjacent soft tissues was performed. COMPARISON:  None. FINDINGS:  Parenchymal Echotexture: Mildly heterogenous Isthmus: 0.2 cm Right lobe: 5.2 x 1.4 x 1.8 cm Left lobe: 3.9 x 1.1 x 1.3 cm _________________________________________________________ Estimated total number of nodules >/= 1 cm: 1 Number of spongiform nodules >/=  2 cm not described below (TR1): 0 Number of mixed cystic and solid nodules >/= 1.5 cm not  described below (TR2): 0 _________________________________________________________ Nodule # 1: Location: Right; Inferior Maximum size: 1.2 cm; Other 2 dimensions: 1.0 x 0.6 cm Composition: mixed cystic and solid (1) Echogenicity: isoechoic (1) Shape: not taller-than-wide (0) Margins: smooth (0) Echogenic foci: none (0) ACR TI-RADS total points: 2. ACR TI-RADS risk category: TR2 (2 points). ACR TI-RADS recommendations: This nodule does NOT meet TI-RADS criteria for biopsy or dedicated follow-up. _________________________________________________________ IMPRESSION: Mildly heterogeneous and borderline enlarged thyroid gland. Incidental note is made of a small 1.2 cm TI-RADS category 2 nodule in the medial right lower gland. This nodule does not require further evaluation or follow-up. The above is in keeping with the ACR TI-RADS recommendations - J Am Coll Radiol 2017;14:587-595. Electronically Signed   By: Malachy Moan M.D.   On: 07/28/2018 11:28       Assessment & Plan:   Problem List Items Addressed This Visit    Thyroid fullness    Had thyroid ultrasound.  Saw endocrinology.  No further w/up.  Slightly elevated tsh on recent labs.  Recheck tsh and free T4.        Relevant Orders   T4, free   Cervical high risk HPV (human papillomavirus) test positive    F/u pap today.        Anxiety    Overall appears to be handling things well.  Request refill xanax to have prn.  Follow.         Other Visit Diagnoses    Elevated TSH    -  Primary   Relevant Orders   TSH   Cervical cancer screening       Relevant Orders   Cytology - PAP( [place])  (Completed)       Dale Pickens, MD

## 2020-02-15 ENCOUNTER — Other Ambulatory Visit: Payer: Self-pay | Admitting: Internal Medicine

## 2020-02-16 LAB — CYTOLOGY - PAP
Comment: NEGATIVE
Diagnosis: NEGATIVE
High risk HPV: NEGATIVE

## 2020-02-17 NOTE — Telephone Encounter (Signed)
rx ok'd for xanax #30 with no refills.   

## 2020-02-21 ENCOUNTER — Encounter: Payer: Self-pay | Admitting: Internal Medicine

## 2020-02-21 NOTE — Assessment & Plan Note (Signed)
Overall appears to be handling things well.  Request refill xanax to have prn.  Follow.

## 2020-02-21 NOTE — Assessment & Plan Note (Signed)
F/u pap today 

## 2020-02-21 NOTE — Assessment & Plan Note (Addendum)
Had thyroid ultrasound.  Saw endocrinology.  No further w/up.  Slightly elevated tsh on recent labs.  Recheck tsh and free T4.

## 2020-04-07 ENCOUNTER — Other Ambulatory Visit (INDEPENDENT_AMBULATORY_CARE_PROVIDER_SITE_OTHER): Payer: 59

## 2020-04-07 ENCOUNTER — Other Ambulatory Visit: Payer: Self-pay

## 2020-04-07 DIAGNOSIS — E0789 Other specified disorders of thyroid: Secondary | ICD-10-CM | POA: Diagnosis not present

## 2020-04-07 DIAGNOSIS — R7989 Other specified abnormal findings of blood chemistry: Secondary | ICD-10-CM

## 2020-04-07 LAB — TSH: TSH: 2.64 u[IU]/mL (ref 0.35–4.50)

## 2020-04-07 LAB — T4, FREE: Free T4: 0.72 ng/dL (ref 0.60–1.60)

## 2020-07-04 ENCOUNTER — Encounter: Payer: Self-pay | Admitting: Internal Medicine

## 2020-07-13 ENCOUNTER — Other Ambulatory Visit: Payer: Self-pay | Admitting: Internal Medicine

## 2020-11-01 ENCOUNTER — Other Ambulatory Visit: Payer: Self-pay | Admitting: Internal Medicine

## 2020-11-01 NOTE — Telephone Encounter (Signed)
Last OV 9/21 next visit scheduled 02/22/21 okay to fill alprazolam last fill 07/13/20 for 30 no refills.

## 2021-02-10 ENCOUNTER — Ambulatory Visit (INDEPENDENT_AMBULATORY_CARE_PROVIDER_SITE_OTHER): Payer: 59 | Admitting: Internal Medicine

## 2021-02-10 ENCOUNTER — Other Ambulatory Visit: Payer: Self-pay

## 2021-02-10 VITALS — BP 110/78 | HR 84 | Temp 97.9°F | Resp 16 | Ht 63.0 in | Wt 137.8 lb

## 2021-02-10 DIAGNOSIS — F419 Anxiety disorder, unspecified: Secondary | ICD-10-CM

## 2021-02-10 DIAGNOSIS — Z13220 Encounter for screening for lipoid disorders: Secondary | ICD-10-CM

## 2021-02-10 DIAGNOSIS — E0789 Other specified disorders of thyroid: Secondary | ICD-10-CM

## 2021-02-10 DIAGNOSIS — Z0000 Encounter for general adult medical examination without abnormal findings: Secondary | ICD-10-CM | POA: Diagnosis not present

## 2021-02-10 MED ORDER — ALPRAZOLAM 0.25 MG PO TABS
0.2500 mg | ORAL_TABLET | Freq: Every day | ORAL | 0 refills | Status: DC | PRN
Start: 2021-02-10 — End: 2021-07-01

## 2021-02-10 NOTE — Progress Notes (Signed)
Patient ID: KELCEY KORUS, female   DOB: Jan 27, 1986, 35 y.o.   MRN: [id number]   Subjective:    Patient ID: Karin Golden, female    DOB: Apr 21, 1986, 35 y.o.   MRN: [id number]  This visit occurred during the SARS-CoV-2 public health emergency.  Safety protocols were in place, including screening questions prior to the visit, additional usage of staff PPE, and extensive cleaning of exam room while observing appropriate contact time as indicated for disinfecting solutions.   Patient here for  Chief Complaint  Patient presents with   Annual Exam   .   HPI She reports she is doing relatively well.  Feels good.  Trying to stay active.  Handing stress.  No chest pain or sob reported.  No abdominal pain or bowel change reported.  LMP three weeks ago  no vaginal problems.    History reviewed. No pertinent past medical history. Past Surgical History:  Procedure Laterality Date   NO PAST SURGERIES     Family History  Problem Relation Age of Onset   Hyperlipidemia Mother    Heart attack Mother        09/2015   Hyperlipidemia Father    Social History   Socioeconomic History   Marital status: Married    Spouse name: Not on file   Number of children: Not on file   Years of education: Not on file   Highest education level: Not on file  Occupational History   Not on file  Tobacco Use   Smoking status: Never   Smokeless tobacco: Never  Substance and Sexual Activity   Alcohol use: Yes    Comment: 3 days per week   Drug use: No   Sexual activity: Yes    Partners: Male    Birth control/protection: None  Other Topics Concern   Not on file  Social History Narrative   Not on file   Social Determinants of Health   Financial Resource Strain: Not on file  Food Insecurity: Not on file  Transportation Needs: Not on file  Physical Activity: Not on file  Stress: Not on file  Social Connections: Not on file    Review of Systems  Constitutional:  Negative for fatigue and unexpected  weight change.  HENT:  Negative for congestion, sinus pressure and sore throat.   Eyes:  Negative for pain and visual disturbance.  Respiratory:  Negative for cough, chest tightness and shortness of breath.   Cardiovascular:  Negative for chest pain, palpitations and leg swelling.  Gastrointestinal:  Negative for abdominal pain, diarrhea, nausea and vomiting.  Genitourinary:  Negative for difficulty urinating and dysuria.  Musculoskeletal:  Negative for back pain and joint swelling.  Skin:  Negative for color change and rash.  Neurological:  Negative for dizziness, light-headedness and headaches.  Hematological:  Negative for adenopathy. Does not bruise/bleed easily.  Psychiatric/Behavioral:  Negative for agitation and dysphoric mood.       Objective:     BP 110/78   Pulse 84   Temp 97.9 F (36.6 C)   Resp 16   Ht 5\' 3"  (1.6 m)   Wt 137 lb 12.8 oz (62.5 kg)   SpO2 99%   BMI 24.41 kg/m  Wt Readings from Last 3 Encounters:  02/10/21 137 lb 12.8 oz (62.5 kg)  02/11/20 136 lb 9.6 oz (62 kg)  08/10/19 134 lb 3.2 oz (60.9 kg)    Physical Exam Vitals reviewed.  Constitutional:      General: She is  not in acute distress.    Appearance: Normal appearance. She is well-developed.  HENT:     Head: Normocephalic and atraumatic.     Right Ear: External ear normal.     Left Ear: External ear normal.  Eyes:     General: No scleral icterus.       Right eye: No discharge.        Left eye: No discharge.     Conjunctiva/sclera: Conjunctivae normal.  Neck:     Thyroid: No thyromegaly.  Cardiovascular:     Rate and Rhythm: Normal rate and regular rhythm.  Pulmonary:     Effort: No tachypnea, accessory muscle usage or respiratory distress.     Breath sounds: Normal breath sounds. No decreased breath sounds or wheezing.  Chest:  Breasts:    Right: No inverted nipple, mass, nipple discharge or tenderness (no axillary adenopathy).     Left: No inverted nipple, mass, nipple discharge or  tenderness (no axilarry adenopathy).  Abdominal:     General: Bowel sounds are normal.     Palpations: Abdomen is soft.     Tenderness: There is no abdominal tenderness.  Musculoskeletal:        General: No swelling or tenderness.     Cervical back: Neck supple.  Lymphadenopathy:     Cervical: No cervical adenopathy.  Skin:    Findings: No erythema or rash.  Neurological:     Mental Status: She is alert and oriented to person, place, and time.  Psychiatric:        Mood and Affect: Mood normal.        Behavior: Behavior normal.     Outpatient Encounter Medications as of 02/10/2021  Medication Sig   ALPRAZolam (XANAX) 0.25 MG tablet Take 1 tablet (0.25 mg total) by mouth daily as needed. for anxiety   [DISCONTINUED] ALPRAZolam (XANAX) 0.25 MG tablet TAKE 1 TABLET (0.25 MG TOTAL) BY MOUTH DAILY AS NEEDED FOR ANXIETY   No facility-administered encounter medications on file as of 02/10/2021.     Lab Results  Component Value Date   WBC 6.3 02/09/2020   HGB 14.0 02/09/2020   HCT 41.4 02/09/2020   PLT 195.0 02/09/2020   GLUCOSE 99 02/09/2020   CHOL 170 02/09/2020   TRIG 85.0 02/09/2020   HDL 60.00 02/09/2020   LDLCALC 93 02/09/2020   ALT 12 02/09/2020   AST 16 02/09/2020   NA 137 02/09/2020   K 3.8 02/09/2020   CL 102 02/09/2020   CREATININE 0.76 02/09/2020   BUN 14 02/09/2020   CO2 27 02/09/2020   TSH 2.64 04/07/2020       Assessment & Plan:   Problem List Items Addressed This Visit     Anxiety    Has xanax to take prn.  Follow.  Overall doing well.       Relevant Medications   ALPRAZolam (XANAX) 0.25 MG tablet   Healthcare maintenance    Physical today 02/10/21.  PAP 02/2020 - negative with negative HPV.  Discussed baseline mammogram.        Thyroid fullness    Had thyroid ultrasound.  Saw endocrinology.  No further w/up.   Recheck tsh.       Relevant Orders   CBC with Differential/Platelet   Comprehensive metabolic panel   TSH   T4, free   Other Visit  Diagnoses     Screening cholesterol level    -  Primary   Relevant Orders   Lipid panel  Esha Fincher, MD  

## 2021-02-11 ENCOUNTER — Encounter: Payer: Self-pay | Admitting: Internal Medicine

## 2021-02-11 NOTE — Assessment & Plan Note (Addendum)
Had thyroid ultrasound.  Saw endocrinology.  No further w/up.   Recheck tsh.  

## 2021-02-11 NOTE — Assessment & Plan Note (Signed)
Physical today 02/10/21.  PAP 02/2020 - negative with negative HPV.  Discussed baseline mammogram.

## 2021-02-11 NOTE — Assessment & Plan Note (Signed)
Has xanax to take prn.  Follow.  Overall doing well.

## 2021-02-22 ENCOUNTER — Other Ambulatory Visit (INDEPENDENT_AMBULATORY_CARE_PROVIDER_SITE_OTHER): Payer: 59

## 2021-02-22 ENCOUNTER — Other Ambulatory Visit: Payer: Self-pay

## 2021-02-22 DIAGNOSIS — E0789 Other specified disorders of thyroid: Secondary | ICD-10-CM | POA: Diagnosis not present

## 2021-02-22 DIAGNOSIS — Z13220 Encounter for screening for lipoid disorders: Secondary | ICD-10-CM | POA: Diagnosis not present

## 2021-02-22 LAB — COMPREHENSIVE METABOLIC PANEL
ALT: 11 U/L (ref 0–35)
AST: 14 U/L (ref 0–37)
Albumin: 4.4 g/dL (ref 3.5–5.2)
Alkaline Phosphatase: 46 U/L (ref 39–117)
BUN: 12 mg/dL (ref 6–23)
CO2: 27 mEq/L (ref 19–32)
Calcium: 8.9 mg/dL (ref 8.4–10.5)
Chloride: 104 mEq/L (ref 96–112)
Creatinine, Ser: 0.78 mg/dL (ref 0.40–1.20)
GFR: 97.32 mL/min (ref >60.00)
Glucose, Bld: 91 mg/dL (ref 70–99)
Potassium: 4 mEq/L (ref 3.5–5.1)
Sodium: 138 mEq/L (ref 135–145)
Total Bilirubin: 0.4 mg/dL (ref 0.2–1.2)
Total Protein: 7.3 g/dL (ref 6.0–8.3)

## 2021-02-22 LAB — LIPID PANEL
Cholesterol: 173 mg/dL (ref 0–200)
HDL: 54.5 mg/dL (ref >39.00)
LDL Cholesterol: 105 mg/dL — ABNORMAL HIGH (ref 0–99)
NonHDL: 118.03
Total CHOL/HDL Ratio: 3
Triglycerides: 65 mg/dL (ref 0.0–149.0)
VLDL: 13 mg/dL (ref 0.0–40.0)

## 2021-02-22 LAB — CBC WITH DIFFERENTIAL/PLATELET
Basophils Absolute: 0 10*3/uL (ref 0.0–0.1)
Basophils Relative: 0.4 % (ref 0.0–3.0)
Eosinophils Absolute: 0.1 10*3/uL (ref 0.0–0.7)
Eosinophils Relative: 2.4 % (ref 0.0–5.0)
HCT: 40.8 % (ref 36.0–46.0)
Hemoglobin: 13.6 g/dL (ref 12.0–15.0)
Lymphocytes Relative: 33 % (ref 12.0–46.0)
Lymphs Abs: 1.9 10*3/uL (ref 0.7–4.0)
MCHC: 33.3 g/dL (ref 30.0–36.0)
MCV: 90 fl (ref 78.0–100.0)
Monocytes Absolute: 0.4 10*3/uL (ref 0.1–1.0)
Monocytes Relative: 6.4 % (ref 3.0–12.0)
Neutro Abs: 3.3 10*3/uL (ref 1.4–7.7)
Neutrophils Relative %: 57.8 % (ref 43.0–77.0)
Platelets: 178 10*3/uL (ref 150.0–400.0)
RBC: 4.53 Mil/uL (ref 3.87–5.11)
RDW: 12.7 % (ref 11.5–15.5)
WBC: 5.8 10*3/uL (ref 4.0–10.5)

## 2021-02-22 LAB — T4, FREE: Free T4: 0.71 ng/dL (ref 0.60–1.60)

## 2021-02-22 LAB — TSH: TSH: 3.9 u[IU]/mL (ref 0.35–5.50)

## 2021-06-30 ENCOUNTER — Other Ambulatory Visit: Payer: Self-pay | Admitting: Internal Medicine

## 2021-12-20 ENCOUNTER — Other Ambulatory Visit: Payer: Self-pay | Admitting: Internal Medicine

## 2022-02-12 ENCOUNTER — Encounter: Payer: 59 | Admitting: Internal Medicine

## 2022-03-07 ENCOUNTER — Ambulatory Visit (INDEPENDENT_AMBULATORY_CARE_PROVIDER_SITE_OTHER): Payer: 59 | Admitting: Internal Medicine

## 2022-03-07 ENCOUNTER — Telehealth: Payer: Self-pay | Admitting: Internal Medicine

## 2022-03-07 DIAGNOSIS — Z0000 Encounter for general adult medical examination without abnormal findings: Secondary | ICD-10-CM

## 2022-03-07 DIAGNOSIS — F419 Anxiety disorder, unspecified: Secondary | ICD-10-CM

## 2022-03-07 NOTE — Progress Notes (Unsigned)
Patient ID: Joanna Nixon, female   DOB: 28-Dec-1985, 36 y.o.   MRN: [id number]   Subjective:    Patient ID: Joanna Nixon, female    DOB: 1986/01/16, 36 y.o.   MRN: [id number]   Patient here for  Chief Complaint  Patient presents with   Follow-up    Complete physical exam   .   HPI    No past medical history on file. Past Surgical History:  Procedure Laterality Date   NO PAST SURGERIES     Family History  Problem Relation Age of Onset   Hyperlipidemia Mother    Heart attack Mother        09/2015   Hyperlipidemia Father    Social History   Socioeconomic History   Marital status: Married    Spouse name: Not on file   Number of children: Not on file   Years of education: Not on file   Highest education level: Not on file  Occupational History   Not on file  Tobacco Use   Smoking status: Never   Smokeless tobacco: Never  Substance and Sexual Activity   Alcohol use: Yes    Comment: 3 days per week   Drug use: No   Sexual activity: Yes    Partners: Male    Birth control/protection: None  Other Topics Concern   Not on file  Social History Narrative   Not on file   Social Determinants of Health   Financial Resource Strain: Not on file  Food Insecurity: Not on file  Transportation Needs: Not on file  Physical Activity: Not on file  Stress: Not on file  Social Connections: Not on file     Review of Systems     Objective:     There were no vitals taken for this visit. Wt Readings from Last 3 Encounters:  02/10/21 137 lb 12.8 oz (62.5 kg)  02/11/20 136 lb 9.6 oz (62 kg)  08/10/19 134 lb 3.2 oz (60.9 kg)    Physical Exam   Outpatient Encounter Medications as of 03/07/2022  Medication Sig   ALPRAZolam (XANAX) 0.25 MG tablet TAKE 1 TABLET (0.25 MG TOTAL) BY MOUTH DAILY AS NEEDED. FOR ANXIETY   No facility-administered encounter medications on file as of 03/07/2022.     Lab Results  Component Value Date   WBC 5.8 02/22/2021   HGB 13.6  02/22/2021   HCT 40.8 02/22/2021   PLT 178.0 02/22/2021   GLUCOSE 91 02/22/2021   CHOL 173 02/22/2021   TRIG 65.0 02/22/2021   HDL 54.50 02/22/2021   LDLCALC 105 (H) 02/22/2021   ALT 11 02/22/2021   AST 14 02/22/2021   NA 138 02/22/2021   K 4.0 02/22/2021   CL 104 02/22/2021   CREATININE 0.78 02/22/2021   BUN 12 02/22/2021   CO2 27 02/22/2021   TSH 3.90 02/22/2021       Assessment & Plan:   Problem List Items Addressed This Visit   None    Einar Pheasant, MD

## 2022-03-07 NOTE — Telephone Encounter (Signed)
Called patient to reschedule physical.

## 2022-03-08 ENCOUNTER — Encounter: Payer: Self-pay | Admitting: Internal Medicine

## 2022-03-08 NOTE — Progress Notes (Signed)
Patient ID: Joanna Nixon, female   DOB: 1986/01/18, 36 y.o.   MRN: [id number] Appt was canceled (changed).  Pt was not seen.

## 2022-04-06 ENCOUNTER — Encounter: Payer: Self-pay | Admitting: Internal Medicine

## 2022-04-06 ENCOUNTER — Ambulatory Visit (INDEPENDENT_AMBULATORY_CARE_PROVIDER_SITE_OTHER): Payer: 59 | Admitting: Internal Medicine

## 2022-04-06 VITALS — BP 108/68 | HR 83 | Temp 98.6°F | Resp 16 | Ht 63.0 in | Wt 138.0 lb

## 2022-04-06 DIAGNOSIS — Z0000 Encounter for general adult medical examination without abnormal findings: Secondary | ICD-10-CM

## 2022-04-06 DIAGNOSIS — E0789 Other specified disorders of thyroid: Secondary | ICD-10-CM

## 2022-04-06 DIAGNOSIS — F419 Anxiety disorder, unspecified: Secondary | ICD-10-CM

## 2022-04-06 DIAGNOSIS — R87810 Cervical high risk human papillomavirus (HPV) DNA test positive: Secondary | ICD-10-CM

## 2022-04-06 DIAGNOSIS — Z13220 Encounter for screening for lipoid disorders: Secondary | ICD-10-CM

## 2022-04-06 MED ORDER — ALPRAZOLAM 0.25 MG PO TABS: 0.2500 mg | ORAL_TABLET | Freq: Every day | ORAL | 0 refills | Status: DC | PRN

## 2022-04-06 NOTE — Progress Notes (Unsigned)
Patient ID: Joanna Nixon, female   DOB: 1985-11-21, 36 y.o.   MRN: [id number]   Subjective:    Patient ID: Joanna Nixon, female    DOB: 01/15/1986, 36 y.o.   MRN: [id number]   Patient here for  Chief Complaint  Patient presents with   Annual Exam    CPE   .   HPI Reports she is doing relatively well.  Increased stress.  Moved.  Remodeling.  Overall she feel she is handling things well.  Tries to stay active.  No chest pain or sob reported.  No abdominal pain or bowel change reported.  Does report heavier periods. Still regular.  Same length.  For a couple of days during her cycle - a little heavier.     History reviewed. No pertinent past medical history. Past Surgical History:  Procedure Laterality Date   NO PAST SURGERIES     Family History  Problem Relation Age of Onset   Hyperlipidemia Mother    Heart attack Mother        09/2015   Hyperlipidemia Father    Social History   Socioeconomic History   Marital status: Married    Spouse name: Not on file   Number of children: Not on file   Years of education: Not on file   Highest education level: Not on file  Occupational History   Not on file  Tobacco Use   Smoking status: Never   Smokeless tobacco: Never  Substance and Sexual Activity   Alcohol use: Yes    Comment: 3 days per week   Drug use: No   Sexual activity: Yes    Partners: Male    Birth control/protection: None  Other Topics Concern   Not on file  Social History Narrative   Not on file   Social Determinants of Health   Financial Resource Strain: Not on file  Food Insecurity: Not on file  Transportation Needs: Not on file  Physical Activity: Not on file  Stress: Not on file  Social Connections: Not on file     Review of Systems  Constitutional:  Negative for appetite change and unexpected weight change.  HENT:  Negative for congestion, sinus pressure and sore throat.   Eyes:  Negative for pain and visual disturbance.  Respiratory:   Negative for cough, chest tightness and shortness of breath.   Cardiovascular:  Negative for chest pain, palpitations and leg swelling.  Gastrointestinal:  Negative for abdominal pain, diarrhea, nausea and vomiting.  Genitourinary:  Negative for difficulty urinating and dysuria.  Musculoskeletal:  Negative for joint swelling and myalgias.  Skin:  Negative for color change and rash.  Neurological:  Negative for dizziness, light-headedness and headaches.  Hematological:  Negative for adenopathy. Does not bruise/bleed easily.  Psychiatric/Behavioral:  Negative for agitation and dysphoric mood.        Objective:     BP 108/68 (BP Location: Left Arm, Patient Position: Sitting, Cuff Size: Small)   Pulse 83   Temp 98.6 F (37 C) (Temporal)   Resp 16   Ht 5\' 3"  (1.6 m)   Wt 138 lb (62.6 kg)   SpO2 99%   BMI 24.45 kg/m  Wt Readings from Last 3 Encounters:  04/06/22 138 lb (62.6 kg)  02/10/21 137 lb 12.8 oz (62.5 kg)  02/11/20 136 lb 9.6 oz (62 kg)    Physical Exam Vitals reviewed.  Constitutional:      General: She is not in acute distress.  Appearance: Normal appearance. She is well-developed.  HENT:     Head: Normocephalic and atraumatic.     Right Ear: External ear normal.     Left Ear: External ear normal.  Eyes:     General: No scleral icterus.       Right eye: No discharge.        Left eye: No discharge.     Conjunctiva/sclera: Conjunctivae normal.  Neck:     Thyroid: No thyromegaly.  Cardiovascular:     Rate and Rhythm: Normal rate and regular rhythm.  Pulmonary:     Effort: No tachypnea, accessory muscle usage or respiratory distress.     Breath sounds: Normal breath sounds. No decreased breath sounds or wheezing.  Chest:  Breasts:    Right: No inverted nipple, mass, nipple discharge or tenderness (no axillary adenopathy).     Left: No inverted nipple, mass, nipple discharge or tenderness (no axilarry adenopathy).  Abdominal:     General: Bowel sounds are  normal.     Palpations: Abdomen is soft.     Tenderness: There is no abdominal tenderness.  Musculoskeletal:        General: No swelling or tenderness.     Cervical back: Neck supple.  Lymphadenopathy:     Cervical: No cervical adenopathy.  Skin:    Findings: No erythema or rash.  Neurological:     Mental Status: She is alert and oriented to person, place, and time.  Psychiatric:        Mood and Affect: Mood normal.        Behavior: Behavior normal.      Outpatient Encounter Medications as of 04/06/2022  Medication Sig   [DISCONTINUED] ALPRAZolam (XANAX) 0.25 MG tablet TAKE 1 TABLET (0.25 MG TOTAL) BY MOUTH DAILY AS NEEDED. FOR ANXIETY   ALPRAZolam (XANAX) 0.25 MG tablet Take 1 tablet (0.25 mg total) by mouth daily as needed. for anxiety   No facility-administered encounter medications on file as of 04/06/2022.     Lab Results  Component Value Date   WBC 5.8 02/22/2021   HGB 13.6 02/22/2021   HCT 40.8 02/22/2021   PLT 178.0 02/22/2021   GLUCOSE 91 02/22/2021   CHOL 173 02/22/2021   TRIG 65.0 02/22/2021   HDL 54.50 02/22/2021   LDLCALC 105 (H) 02/22/2021   ALT 11 02/22/2021   AST 14 02/22/2021   NA 138 02/22/2021   K 4.0 02/22/2021   CL 104 02/22/2021   CREATININE 0.78 02/22/2021   BUN 12 02/22/2021   CO2 27 02/22/2021   TSH 3.90 02/22/2021       Assessment & Plan:   Problem List Items Addressed This Visit     Anxiety    Increased stress recently as outlined.  Takes xanax prn.  Rarely takes.  Follow.  Refilled.       Relevant Medications   ALPRAZolam (XANAX) 0.25 MG tablet   Other Relevant Orders   CBC w/Diff   Basic Metabolic Panel (BMET)   Cervical high risk HPV (human papillomavirus) test positive    PAP 02/2020 - negative with negative HPV.       Healthcare maintenance    Physical today 04/06/22.  PAP 02/2020 - negative with negative HPV.  Discussed mammogram.       Thyroid fullness    Had thyroid ultrasound.  Saw endocrinology.  No further w/up.    Recheck tsh.       Relevant Orders   TSH   T4, free  Other Visit Diagnoses     Routine general medical examination at a health care facility    -  Primary   Screening cholesterol level       Relevant Orders   Lipid Profile   Hepatic function panel        Dale [place], MD

## 2022-04-06 NOTE — Assessment & Plan Note (Signed)
Physical today 04/06/22.  PAP 02/2020 - negative with negative HPV.  Discussed mammogram.

## 2022-04-07 ENCOUNTER — Encounter: Payer: Self-pay | Admitting: Internal Medicine

## 2022-04-07 NOTE — Assessment & Plan Note (Signed)
Had thyroid ultrasound.  Saw endocrinology.  No further w/up.   Recheck tsh.

## 2022-04-07 NOTE — Assessment & Plan Note (Signed)
PAP 02/2020 - negative with negative HPV.  

## 2022-04-07 NOTE — Assessment & Plan Note (Signed)
Increased stress recently as outlined.  Takes xanax prn.  Rarely takes.  Follow.  Refilled.

## 2022-04-20 ENCOUNTER — Other Ambulatory Visit (INDEPENDENT_AMBULATORY_CARE_PROVIDER_SITE_OTHER): Payer: 59

## 2022-04-20 DIAGNOSIS — E0789 Other specified disorders of thyroid: Secondary | ICD-10-CM | POA: Diagnosis not present

## 2022-04-20 DIAGNOSIS — Z13220 Encounter for screening for lipoid disorders: Secondary | ICD-10-CM

## 2022-04-20 DIAGNOSIS — F419 Anxiety disorder, unspecified: Secondary | ICD-10-CM | POA: Diagnosis not present

## 2022-04-20 LAB — BASIC METABOLIC PANEL
BUN: 12 mg/dL (ref 6–23)
CO2: 27 mEq/L (ref 19–32)
Calcium: 8.4 mg/dL (ref 8.4–10.5)
Chloride: 106 mEq/L (ref 96–112)
Creatinine, Ser: 0.81 mg/dL (ref 0.40–1.20)
GFR: 92.25 mL/min (ref >60.00)
Glucose, Bld: 88 mg/dL (ref 70–99)
Potassium: 4.1 mEq/L (ref 3.5–5.1)
Sodium: 138 mEq/L (ref 135–145)

## 2022-04-20 LAB — CBC WITH DIFFERENTIAL/PLATELET
Basophils Absolute: 0 10*3/uL (ref 0.0–0.1)
Basophils Relative: 0.5 % (ref 0.0–3.0)
Eosinophils Absolute: 0.1 10*3/uL (ref 0.0–0.7)
Eosinophils Relative: 2 % (ref 0.0–5.0)
HCT: 40 % (ref 36.0–46.0)
Hemoglobin: 13.4 g/dL (ref 12.0–15.0)
Lymphocytes Relative: 29.5 % (ref 12.0–46.0)
Lymphs Abs: 1.8 10*3/uL (ref 0.7–4.0)
MCHC: 33.4 g/dL (ref 30.0–36.0)
MCV: 89.8 fl (ref 78.0–100.0)
Monocytes Absolute: 0.4 10*3/uL (ref 0.1–1.0)
Monocytes Relative: 6.1 % (ref 3.0–12.0)
Neutro Abs: 3.7 10*3/uL (ref 1.4–7.7)
Neutrophils Relative %: 61.9 % (ref 43.0–77.0)
Platelets: 200 10*3/uL (ref 150.0–400.0)
RBC: 4.46 Mil/uL (ref 3.87–5.11)
RDW: 13.2 % (ref 11.5–15.5)
WBC: 6 10*3/uL (ref 4.0–10.5)

## 2022-04-20 LAB — LIPID PANEL
Cholesterol: 176 mg/dL (ref 0–200)
HDL: 53.8 mg/dL (ref >39.00)
LDL Cholesterol: 111 mg/dL — ABNORMAL HIGH (ref 0–99)
NonHDL: 121.78
Total CHOL/HDL Ratio: 3
Triglycerides: 54 mg/dL (ref 0.0–149.0)
VLDL: 10.8 mg/dL (ref 0.0–40.0)

## 2022-04-20 LAB — HEPATIC FUNCTION PANEL
ALT: 11 U/L (ref 0–35)
AST: 13 U/L (ref 0–37)
Albumin: 4.4 g/dL (ref 3.5–5.2)
Alkaline Phosphatase: 45 U/L (ref 39–117)
Bilirubin, Direct: 0.1 mg/dL (ref 0.0–0.3)
Total Bilirubin: 0.3 mg/dL (ref 0.2–1.2)
Total Protein: 6.8 g/dL (ref 6.0–8.3)

## 2022-04-20 LAB — T4, FREE: Free T4: 0.8 ng/dL (ref 0.60–1.60)

## 2022-04-20 LAB — TSH: TSH: 2.71 u[IU]/mL (ref 0.35–5.50)

## 2022-07-17 ENCOUNTER — Other Ambulatory Visit: Payer: Self-pay | Admitting: Internal Medicine

## 2022-07-17 NOTE — Telephone Encounter (Signed)
Refilled: 04/06/2022 Last OV: 04/06/2022 Next OV: 04/08/2023

## 2022-07-18 NOTE — Telephone Encounter (Signed)
Rx ok'd for xanax #30 with no refills.

## 2022-12-06 DIAGNOSIS — L559 Sunburn, unspecified: Secondary | ICD-10-CM | POA: Diagnosis not present

## 2022-12-13 ENCOUNTER — Other Ambulatory Visit: Payer: Self-pay | Admitting: Internal Medicine

## 2023-04-08 ENCOUNTER — Ambulatory Visit (INDEPENDENT_AMBULATORY_CARE_PROVIDER_SITE_OTHER): Payer: BC Managed Care – PPO | Admitting: Internal Medicine

## 2023-04-08 ENCOUNTER — Encounter: Payer: Self-pay | Admitting: Internal Medicine

## 2023-04-08 ENCOUNTER — Other Ambulatory Visit (HOSPITAL_COMMUNITY)
Admission: RE | Admit: 2023-04-08 | Discharge: 2023-04-08 | Disposition: A | Discharge status: Home / Self Care | Payer: BC Managed Care – PPO | Source: Ambulatory Visit | Attending: Internal Medicine | Admitting: Internal Medicine

## 2023-04-08 VITALS — BP 112/70 | HR 86 | Temp 98.1°F | Resp 15 | Ht 63.0 in | Wt 141.0 lb

## 2023-04-08 DIAGNOSIS — Z0000 Encounter for general adult medical examination without abnormal findings: Secondary | ICD-10-CM | POA: Diagnosis not present

## 2023-04-08 DIAGNOSIS — Z1231 Encounter for screening mammogram for malignant neoplasm of breast: Secondary | ICD-10-CM

## 2023-04-08 DIAGNOSIS — Z13220 Encounter for screening for lipoid disorders: Secondary | ICD-10-CM | POA: Diagnosis not present

## 2023-04-08 DIAGNOSIS — Z124 Encounter for screening for malignant neoplasm of cervix: Secondary | ICD-10-CM | POA: Insufficient documentation

## 2023-04-08 DIAGNOSIS — E0789 Other specified disorders of thyroid: Secondary | ICD-10-CM

## 2023-04-08 DIAGNOSIS — F419 Anxiety disorder, unspecified: Secondary | ICD-10-CM

## 2023-04-08 DIAGNOSIS — R059 Cough, unspecified: Secondary | ICD-10-CM | POA: Insufficient documentation

## 2023-04-08 LAB — CBC WITH DIFFERENTIAL/PLATELET
Basophils Absolute: 0 10*3/uL (ref 0.0–0.1)
Basophils Relative: 0.5 % (ref 0.0–3.0)
Eosinophils Absolute: 0.1 10*3/uL (ref 0.0–0.7)
Eosinophils Relative: 1.5 % (ref 0.0–5.0)
HCT: 40 % (ref 36.0–46.0)
Hemoglobin: 13 g/dL (ref 12.0–15.0)
Lymphocytes Relative: 30.1 % (ref 12.0–46.0)
Lymphs Abs: 1.6 10*3/uL (ref 0.7–4.0)
MCHC: 32.5 g/dL (ref 30.0–36.0)
MCV: 90.3 fL (ref 78.0–100.0)
Monocytes Absolute: 0.4 10*3/uL (ref 0.1–1.0)
Monocytes Relative: 6.7 % (ref 3.0–12.0)
Neutro Abs: 3.3 10*3/uL (ref 1.4–7.7)
Neutrophils Relative %: 61.2 % (ref 43.0–77.0)
Platelets: 229 10*3/uL (ref 150.0–400.0)
RBC: 4.42 Mil/uL (ref 3.87–5.11)
RDW: 13.1 % (ref 11.5–15.5)
WBC: 5.4 10*3/uL (ref 4.0–10.5)

## 2023-04-08 LAB — LIPID PANEL
Cholesterol: 179 mg/dL (ref 0–200)
HDL: 58.7 mg/dL (ref >39.00)
LDL Cholesterol: 102 mg/dL — ABNORMAL HIGH (ref 0–99)
NonHDL: 120.25
Total CHOL/HDL Ratio: 3
Triglycerides: 89 mg/dL (ref 0.0–149.0)
VLDL: 17.8 mg/dL (ref 0.0–40.0)

## 2023-04-08 LAB — COMPREHENSIVE METABOLIC PANEL
ALT: 19 U/L (ref 0–35)
AST: 18 U/L (ref 0–37)
Albumin: 4.3 g/dL (ref 3.5–5.2)
Alkaline Phosphatase: 53 U/L (ref 39–117)
BUN: 8 mg/dL (ref 6–23)
CO2: 28 meq/L (ref 19–32)
Calcium: 9 mg/dL (ref 8.4–10.5)
Chloride: 105 meq/L (ref 96–112)
Creatinine, Ser: 0.74 mg/dL (ref 0.40–1.20)
GFR: 102.13 mL/min (ref >60.00)
Glucose, Bld: 93 mg/dL (ref 70–99)
Potassium: 3.9 meq/L (ref 3.5–5.1)
Sodium: 140 meq/L (ref 135–145)
Total Bilirubin: 0.3 mg/dL (ref 0.2–1.2)
Total Protein: 6.9 g/dL (ref 6.0–8.3)

## 2023-04-08 LAB — TSH: TSH: 2.48 u[IU]/mL (ref 0.35–5.50)

## 2023-04-08 MED ORDER — ALPRAZOLAM 0.25 MG PO TABS: 0.2500 mg | ORAL_TABLET | Freq: Every day | ORAL | 0 refills | Status: DC | PRN

## 2023-04-08 NOTE — Assessment & Plan Note (Addendum)
Physical today 04/08/23.  PAP 02/2020 - negative with negative HPV.  Repeat pap today. Discussed mammogram. Agreeable to schedule.

## 2023-04-08 NOTE — Assessment & Plan Note (Signed)
Still with some cough as outlined. Is better.  Discussed treating acid reflux.  Delsym prn.  Follow.

## 2023-04-08 NOTE — Progress Notes (Signed)
Subjective:    Patient ID: Joanna Nixon, female    DOB: 1985/09/14, 37 y.o.   MRN: [id number]  Patient here for  Chief Complaint  Patient presents with   Annual Exam    HPI Here for a physical exam.  She reports she is doing relatively well.  Has been exercising - walking and elliptical. No chest pain or sob with increased activity or exertion. Was sick about 6 weeks ago.  Has had a lingering cough, that is getting better.  No chest congestion or sob reported. Some occasional acid reflux. Discussed avoiding foods that aggravate and avoid eating late.  Does not occur often. Some occasional chest pain- but is not triggered by exercise or exertion.  Periods may be a little heavier and period may last 4-5 days.  No spotting in between.    History reviewed. No pertinent past medical history. Past Surgical History:  Procedure Laterality Date   NO PAST SURGERIES     Family History  Problem Relation Age of Onset   Hyperlipidemia Mother    Heart attack Mother        09/2015   Hyperlipidemia Father    Social History   Socioeconomic History   Marital status: Married    Spouse name: Not on file   Number of children: Not on file   Years of education: Not on file   Highest education level: Not on file  Occupational History   Not on file  Tobacco Use   Smoking status: Never   Smokeless tobacco: Never  Substance and Sexual Activity   Alcohol use: Yes    Comment: 3 days per week   Drug use: No   Sexual activity: Yes    Partners: Male    Birth control/protection: None  Other Topics Concern   Not on file  Social History Narrative   Not on file   Social Determinants of Health   Financial Resource Strain: Not on file  Food Insecurity: Not on file  Transportation Needs: Not on file  Physical Activity: Not on file  Stress: Not on file  Social Connections: Not on file     Review of Systems  Constitutional:  Negative for appetite change and unexpected weight change.  HENT:   Negative for congestion, sinus pressure and sore throat.   Eyes:  Negative for pain and visual disturbance.  Respiratory:  Positive for cough. Negative for chest tightness and shortness of breath.   Cardiovascular:  Negative for chest pain, palpitations and leg swelling.  Gastrointestinal:  Negative for abdominal pain, diarrhea, nausea and vomiting.  Genitourinary:  Negative for difficulty urinating and dysuria.  Musculoskeletal:  Negative for joint swelling and myalgias.  Skin:  Negative for color change and rash.  Neurological:  Negative for dizziness and headaches.  Hematological:  Negative for adenopathy. Does not bruise/bleed easily.  Psychiatric/Behavioral:  Negative for agitation and dysphoric mood.        Objective:     BP 112/70   Pulse 86   Temp 98.1 F (36.7 C)   Resp 15   Ht 5\' 3"  (1.6 m)   Wt 141 lb (64 kg)   SpO2 98%   BMI 24.98 kg/m  Wt Readings from Last 3 Encounters:  04/08/23 141 lb (64 kg)  04/06/22 138 lb (62.6 kg)  02/10/21 137 lb 12.8 oz (62.5 kg)    Physical Exam Vitals reviewed.  Constitutional:      General: She is not in acute distress.  Appearance: Normal appearance. She is well-developed.  HENT:     Head: Normocephalic and atraumatic.     Right Ear: External ear normal.     Left Ear: External ear normal.  Eyes:     General: No scleral icterus.       Right eye: No discharge.        Left eye: No discharge.     Conjunctiva/sclera: Conjunctivae normal.  Neck:     Thyroid: No thyromegaly.  Cardiovascular:     Rate and Rhythm: Normal rate and regular rhythm.  Pulmonary:     Effort: No tachypnea, accessory muscle usage or respiratory distress.     Breath sounds: Normal breath sounds. No decreased breath sounds or wheezing.  Chest:  Breasts:    Right: No inverted nipple, mass, nipple discharge or tenderness (no axillary adenopathy).     Left: No inverted nipple, mass, nipple discharge or tenderness (no axilarry adenopathy).  Abdominal:      General: Bowel sounds are normal.     Palpations: Abdomen is soft.     Tenderness: There is no abdominal tenderness.  Genitourinary:    Comments: Normal external genitalia.  Vaginal vault without lesions.  Cervix identified.  Pap smear performed.  Could not appreciate any adnexal masses or tenderness.   Musculoskeletal:        General: No swelling or tenderness.     Cervical back: Neck supple.  Lymphadenopathy:     Cervical: No cervical adenopathy.  Skin:    Findings: No erythema or rash.  Neurological:     Mental Status: She is alert and oriented to person, place, and time.  Psychiatric:        Mood and Affect: Mood normal.        Behavior: Behavior normal.      Outpatient Encounter Medications as of 04/08/2023  Medication Sig   [DISCONTINUED] ALPRAZolam (XANAX) 0.25 MG tablet TAKE 1 TABLET (0.25 MG TOTAL) BY MOUTH DAILY AS NEEDED. FOR ANXIETY   ALPRAZolam (XANAX) 0.25 MG tablet Take 1 tablet (0.25 mg total) by mouth daily as needed. for anxiety   No facility-administered encounter medications on file as of 04/08/2023.     Lab Results  Component Value Date   WBC 5.4 04/08/2023   HGB 13.0 04/08/2023   HCT 40.0 04/08/2023   PLT 229.0 04/08/2023   GLUCOSE 93 04/08/2023   CHOL 179 04/08/2023   TRIG 89.0 04/08/2023   HDL 58.70 04/08/2023   LDLCALC 102 (H) 04/08/2023   ALT 19 04/08/2023   AST 18 04/08/2023   NA 140 04/08/2023   K 3.9 04/08/2023   CL 105 04/08/2023   CREATININE 0.74 04/08/2023   BUN 8 04/08/2023   CO2 28 04/08/2023   TSH 2.48 04/08/2023    No results found.     Assessment & Plan:  Routine general medical examination at a health care facility  Healthcare maintenance Assessment & Plan: Physical today 04/08/23.  PAP 02/2020 - negative with negative HPV.  Repeat pap today. Discussed mammogram. Agreeable to schedule.    Screening for cervical cancer -     Cytology - PAP  Encounter for screening mammogram for malignant neoplasm of breast -      3D Screening Mammogram, Left and Right; Future  Thyroid fullness Assessment & Plan: Had thyroid ultrasound.  Saw endocrinology.  No further w/up.   Recheck tsh.   Orders: -     CBC with Differential/Platelet -     Comprehensive metabolic panel -  TSH  Screening cholesterol level -     Lipid panel  Anxiety Assessment & Plan: Overall appear to be handling things relatively well.  Follow.  Takes xanax prn.    Cough, unspecified type Assessment & Plan: Still with some cough as outlined. Is better.  Discussed treating acid reflux.  Delsym prn.  Follow.    Other orders -     ALPRAZolam; Take 1 tablet (0.25 mg total) by mouth daily as needed. for anxiety  Dispense: 30 tablet; Refill: 0     Dale Horton, MD

## 2023-04-08 NOTE — Patient Instructions (Signed)
Pepcid (famotidine) - 20mg  - take one tablet 30 minutes before your evening meal.   Delsym cough syrup - if needed.

## 2023-04-08 NOTE — Assessment & Plan Note (Signed)
Had thyroid ultrasound.  Saw endocrinology.  No further w/up.   Recheck tsh.  

## 2023-04-08 NOTE — Assessment & Plan Note (Signed)
Overall appear to be handling things relatively well.  Follow.  Takes xanax prn.

## 2023-04-11 LAB — CYTOLOGY - PAP
Comment: NEGATIVE
Diagnosis: NEGATIVE
High risk HPV: NEGATIVE

## 2023-04-25 ENCOUNTER — Ambulatory Visit
Admission: RE | Admit: 2023-04-25 | Discharge: 2023-04-25 | Disposition: A | Discharge status: Home / Self Care | Payer: BC Managed Care – PPO | Source: Ambulatory Visit | Attending: Internal Medicine | Admitting: Internal Medicine

## 2023-04-25 DIAGNOSIS — Z1231 Encounter for screening mammogram for malignant neoplasm of breast: Secondary | ICD-10-CM | POA: Insufficient documentation

## 2023-07-31 ENCOUNTER — Ambulatory Visit: Payer: BC Managed Care – PPO | Admitting: Family Medicine

## 2023-07-31 VITALS — BP 116/72 | HR 123 | Temp 101.0°F | Ht 63.0 in | Wt 139.6 lb

## 2023-07-31 DIAGNOSIS — J101 Influenza due to other identified influenza virus with other respiratory manifestations: Secondary | ICD-10-CM

## 2023-07-31 DIAGNOSIS — R509 Fever, unspecified: Secondary | ICD-10-CM

## 2023-07-31 LAB — POCT INFLUENZA A/B
Influenza A, POC: POSITIVE — AB
Influenza B, POC: NEGATIVE

## 2023-07-31 LAB — POC COVID19 BINAXNOW: SARS Coronavirus 2 Ag: NEGATIVE

## 2023-07-31 NOTE — Progress Notes (Signed)
  Marikay Alar, MD Phone: [phone]  Joanna Nixon is a 38 y.o. female who presents today for same-day visit.  Congestion: Patient notes onset of symptoms last week.  Had some chills and fevers on Friday and rested and then those symptoms resolved on Saturday.  Then had head cold symptoms.  This morning she ended up with significant chills with fever as well as body aches, sore throat, postnasal drip.  She notes her congestion is generally stable.  She notes some soreness in her chest and some dyspnea.  Notes her son had flu though this was several weeks ago.  She has tried DayQuil, NyQuil, and Sudafed for her prior congestion.  Social History   Tobacco Use  Smoking Status Never  Smokeless Tobacco Never    Current Outpatient Medications on File Prior to Visit  Medication Sig Dispense Refill   ALPRAZolam (XANAX) 0.25 MG tablet Take 1 tablet (0.25 mg total) by mouth daily as needed. for anxiety 30 tablet 0   No current facility-administered medications on file prior to visit.     ROS see history of present illness  Objective  Physical Exam Vitals:   07/31/23 1129  BP: 116/72  Pulse: (!) 123  Temp: (!) 101 F (38.3 C)  SpO2: 98%    BP Readings from Last 3 Encounters:  07/31/23 116/72  04/08/23 112/70  04/06/22 108/68   Wt Readings from Last 3 Encounters:  07/31/23 139 lb 9.6 oz (63.3 kg)  04/08/23 141 lb (64 kg)  04/06/22 138 lb (62.6 kg)    Physical Exam Constitutional:      General: She is not in acute distress.    Appearance: She is not diaphoretic.  HENT:     Mouth/Throat:     Mouth: Mucous membranes are moist.     Pharynx: Oropharynx is clear.  Cardiovascular:     Rate and Rhythm: Normal rate and regular rhythm.     Heart sounds: Normal heart sounds.  Pulmonary:     Effort: Pulmonary effort is normal.     Breath sounds: Normal breath sounds.  Skin:    General: Skin is warm and dry.  Neurological:     Mental Status: She is alert.       Assessment/Plan: Please see individual problem list.  Influenza A Assessment & Plan: I suspect the patient probably had a viral illness starting last week and then her sudden onset symptoms today are likely related to influenza given that she has a positive influenza A test.  Discussed treating with Tamiflu.  Discussed psychiatric risk of Tamiflu.  Patient opted against use of Tamiflu.  Advised to seek medical attention for cough productive of blood, worsening shortness of breath, or progressively worsening symptoms or fevers that will not come down with Tylenol or ibuprofen.  Discussed use of over-the-counter Tylenol 1000 mg every 8 hours as needed and ibuprofen 800 mg every 8 hours as needed to help with her fever.   Fever, unspecified fever cause -     POCT Influenza A/B -     POC COVID-19 BinaxNow    Return if symptoms worsen or fail to improve.   Marikay Alar, MD [facility]

## 2023-07-31 NOTE — Patient Instructions (Addendum)
 Nice to see you. You can take Tylenol 1000 mg every 8 hours as needed and ibuprofen 800 mg every 8 hours as needed to help with fever and bodyaches. If you develop cough productive of blood, worsening shortness of breath, fevers that do not come down with Tylenol or ibuprofen, or worsening symptoms please seek medical attention immediately.

## 2023-07-31 NOTE — Assessment & Plan Note (Addendum)
 I suspect the patient probably had a viral illness starting last week and then her sudden onset symptoms today are likely related to influenza given that she has a positive influenza A test.  Discussed treating with Tamiflu.  Discussed psychiatric risk of Tamiflu.  Patient opted against use of Tamiflu.  Advised to seek medical attention for cough productive of blood, worsening shortness of breath, or progressively worsening symptoms or fevers that will not come down with Tylenol or ibuprofen.  Discussed use of over-the-counter Tylenol 1000 mg every 8 hours as needed and ibuprofen 800 mg every 8 hours as needed to help with her fever.

## 2023-10-24 ENCOUNTER — Other Ambulatory Visit: Payer: Self-pay | Admitting: Internal Medicine

## 2023-10-24 NOTE — Telephone Encounter (Signed)
 PDMP reviewed. Xanax  refilled #30 with no refills. Make sure has f/u appt scheduled with me.

## 2024-01-28 ENCOUNTER — Other Ambulatory Visit: Payer: Self-pay | Admitting: Internal Medicine

## 2024-01-28 NOTE — Telephone Encounter (Signed)
 Received a request for refill xanax . Last seen 04/2023. No f/u appt scheduled. Please call and schedule f/u  appt (or physical if due). Confirm how often taking xanax . When scheduled, let me know and can refill until appt.

## 2024-01-30 ENCOUNTER — Other Ambulatory Visit: Payer: Self-pay

## 2024-01-30 ENCOUNTER — Telehealth: Payer: Self-pay

## 2024-01-30 MED ORDER — ALPRAZOLAM 0.25 MG PO TABS: 0.2500 mg | ORAL_TABLET | Freq: Every day | ORAL | 0 refills | Status: DC | PRN

## 2024-01-30 NOTE — Telephone Encounter (Signed)
 LM for patient. Please relay message below and schedule

## 2024-01-30 NOTE — Telephone Encounter (Signed)
Sent request to PCP

## 2024-01-30 NOTE — Telephone Encounter (Signed)
 ALPRAZolam  (XANAX ) 0.25 MG tablet   Last refilled 10/24/2023 # 30 0 RF  LOV 07/07/2023 NOV 04/14/2024  Pt stated that she only take 1 or 2 a week does not take daily.

## 2024-01-30 NOTE — Telephone Encounter (Signed)
 Copied from CRM [id number]. Topic: Clinical - Medication Question >> Jan 30, 2024 10:15 AM Burnard DEL wrote: Reason for RMF:Ejupzwu called in and stated that she is only taking the xanax  once maybe twice a week.Patient scheduled for physical in November,when due.   [id number] [address], [address]          [phone] [phone]

## 2024-01-30 NOTE — Telephone Encounter (Signed)
 Reviewed PDMP. Appt made for physical. Refill xanax  x 1.

## 2024-04-14 ENCOUNTER — Ambulatory Visit (INDEPENDENT_AMBULATORY_CARE_PROVIDER_SITE_OTHER): Payer: Self-pay | Admitting: Internal Medicine

## 2024-04-14 ENCOUNTER — Encounter: Payer: Self-pay | Admitting: Internal Medicine

## 2024-04-14 VITALS — BP 94/70 | HR 77 | Temp 98.2°F | Ht 63.0 in | Wt 130.2 lb

## 2024-04-14 DIAGNOSIS — Z1231 Encounter for screening mammogram for malignant neoplasm of breast: Secondary | ICD-10-CM

## 2024-04-14 DIAGNOSIS — Z0000 Encounter for general adult medical examination without abnormal findings: Secondary | ICD-10-CM

## 2024-04-14 DIAGNOSIS — E0789 Other specified disorders of thyroid: Secondary | ICD-10-CM

## 2024-04-14 DIAGNOSIS — Z13220 Encounter for screening for lipoid disorders: Secondary | ICD-10-CM | POA: Diagnosis not present

## 2024-04-14 DIAGNOSIS — F419 Anxiety disorder, unspecified: Secondary | ICD-10-CM

## 2024-04-14 MED ORDER — ALPRAZOLAM 0.25 MG PO TABS: 0.2500 mg | ORAL_TABLET | Freq: Every day | ORAL | 0 refills | Status: AC | PRN

## 2024-04-14 NOTE — Progress Notes (Signed)
 Subjective:    Patient ID: Joanna Nixon, female    DOB: Apr 03, 1986, 38 y.o.   MRN: [id number]  Patient here for  Chief Complaint  Patient presents with   Annual Exam    HPI Here for physical exam. Doing well. No chest pain or sob reported. No increased cough or congestion. No abdominal pain or bowel change.    History reviewed. No pertinent past medical history. Past Surgical History:  Procedure Laterality Date   NO PAST SURGERIES     Family History  Problem Relation Age of Onset   Hyperlipidemia Mother    Heart attack Mother        09/2015   Hyperlipidemia Father    Breast cancer Neg Hx    Social History   Socioeconomic History   Marital status: Married    Spouse name: Not on file   Number of children: Not on file   Years of education: Not on file   Highest education level: 12th grade  Occupational History   Not on file  Tobacco Use   Smoking status: Never   Smokeless tobacco: Never  Substance and Sexual Activity   Alcohol use: Yes    Comment: 3 days per week   Drug use: No   Sexual activity: Yes    Partners: Male    Birth control/protection: None  Other Topics Concern   Not on file  Social History Narrative   Not on file   Social Drivers of Health   Financial Resource Strain: Low Risk  (07/31/2023)   Overall Financial Resource Strain (CARDIA)    Difficulty of Paying Living Expenses: Not hard at all  Food Insecurity: No Food Insecurity (07/31/2023)   Hunger Vital Sign    Worried About Running Out of Food in the Last Year: Never true    Ran Out of Food in the Last Year: Never true  Transportation Needs: No Transportation Needs (07/31/2023)   PRAPARE - Administrator, Civil Service (Medical): No    Lack of Transportation (Non-Medical): No  Physical Activity: Insufficiently Active (07/31/2023)   Exercise Vital Sign    Days of Exercise per Week: 3 days    Minutes of Exercise per Session: 20 min  Stress: No Stress Concern Present (07/31/2023)    [employer] of Occupational Health - Occupational Stress Questionnaire    Feeling of Stress : Only a little  Social Connections: Moderately Isolated (07/31/2023)   Social Connection and Isolation Panel    Frequency of Communication with Friends and Family: Three times a week    Frequency of Social Gatherings with Friends and Family: Twice a week    Attends Religious Services: Never    Database Administrator or Organizations: No    Attends Engineer, Structural: Not on file    Marital Status: Married     Review of Systems  Constitutional:  Negative for appetite change and unexpected weight change.  HENT:  Negative for congestion, sinus pressure and sore throat.   Eyes:  Negative for pain and visual disturbance.  Respiratory:  Negative for cough, chest tightness and shortness of breath.   Cardiovascular:  Negative for chest pain, palpitations and leg swelling.  Gastrointestinal:  Negative for abdominal pain, diarrhea, nausea and vomiting.  Genitourinary:  Negative for difficulty urinating and dysuria.  Musculoskeletal:  Negative for joint swelling and myalgias.  Skin:  Negative for color change and rash.  Neurological:  Negative for dizziness and headaches.  Hematological:  Negative  for adenopathy. Does not bruise/bleed easily.  Psychiatric/Behavioral:  Negative for agitation and dysphoric mood.        Objective:     BP 94/70   Pulse 77   Temp 98.2 F (36.8 C) (Oral)   Ht 5' 3 (1.6 m)   Wt 130 lb 3.2 oz (59.1 kg)   LMP 03/17/2024 (Approximate)   SpO2 95%   BMI 23.06 kg/m  Wt Readings from Last 3 Encounters:  04/14/24 130 lb 3.2 oz (59.1 kg)  07/31/23 139 lb 9.6 oz (63.3 kg)  04/08/23 141 lb (64 kg)    Physical Exam Vitals reviewed.  Constitutional:      General: She is not in acute distress.    Appearance: Normal appearance. She is well-developed.  HENT:     Head: Normocephalic and atraumatic.     Right Ear: External ear normal.     Left Ear:  External ear normal.     Mouth/Throat:     Pharynx: No oropharyngeal exudate or posterior oropharyngeal erythema.  Eyes:     General: No scleral icterus.       Right eye: No discharge.        Left eye: No discharge.     Conjunctiva/sclera: Conjunctivae normal.  Neck:     Thyroid : No thyromegaly.  Cardiovascular:     Rate and Rhythm: Normal rate and regular rhythm.  Pulmonary:     Effort: No tachypnea, accessory muscle usage or respiratory distress.     Breath sounds: Normal breath sounds. No decreased breath sounds or wheezing.  Chest:  Breasts:    Right: No inverted nipple, mass, nipple discharge or tenderness (no axillary adenopathy).     Left: No inverted nipple, mass, nipple discharge or tenderness (no axilarry adenopathy).  Abdominal:     General: Bowel sounds are normal.     Palpations: Abdomen is soft.     Tenderness: There is no abdominal tenderness.  Musculoskeletal:        General: No swelling or tenderness.     Cervical back: Neck supple.  Lymphadenopathy:     Cervical: No cervical adenopathy.  Skin:    Findings: No erythema or rash.  Neurological:     Mental Status: She is alert and oriented to person, place, and time.  Psychiatric:        Mood and Affect: Mood normal.        Behavior: Behavior normal.         Outpatient Encounter Medications as of 04/14/2024  Medication Sig   ALPRAZolam  (XANAX ) 0.25 MG tablet Take 1 tablet (0.25 mg total) by mouth daily as needed. for anxiety   [DISCONTINUED] ALPRAZolam  (XANAX ) 0.25 MG tablet Take 1 tablet (0.25 mg total) by mouth daily as needed. for anxiety   No facility-administered encounter medications on file as of 04/14/2024.     Lab Results  Component Value Date   WBC 5.4 04/08/2023   HGB 13.0 04/08/2023   HCT 40.0 04/08/2023   PLT 229.0 04/08/2023   GLUCOSE 93 04/08/2023   CHOL 179 04/08/2023   TRIG 89.0 04/08/2023   HDL 58.70 04/08/2023   LDLCALC 102 (H) 04/08/2023   ALT 19 04/08/2023   AST 18  04/08/2023   NA 140 04/08/2023   K 3.9 04/08/2023   CL 105 04/08/2023   CREATININE 0.74 04/08/2023   BUN 8 04/08/2023   CO2 28 04/08/2023   TSH 2.48 04/08/2023    MM 3D SCREENING MAMMOGRAM BILATERAL BREAST Result Date: 04/29/2023 CLINICAL DATA:  Screening. EXAM: DIGITAL SCREENING BILATERAL MAMMOGRAM WITH TOMOSYNTHESIS AND CAD TECHNIQUE: Bilateral screening digital craniocaudal and mediolateral oblique mammograms were obtained. Bilateral screening digital breast tomosynthesis was performed. The images were evaluated with computer-aided detection. COMPARISON:  None available. ACR Breast Density Category c: The breasts are heterogeneously dense, which may obscure small masses. FINDINGS: There are no findings suspicious for malignancy. IMPRESSION: No mammographic evidence of malignancy. A result letter of this screening mammogram will be mailed directly to the patient. RECOMMENDATION: Screening mammogram in one year. (Code:SM-B-01Y) BI-RADS CATEGORY  1: Negative. Electronically Signed   By: Corean Salter M.D.   On: 04/29/2023 12:51       Assessment & Plan:  Healthcare maintenance Assessment & Plan: Physical today 04/14/24.  PAP 02/2020 - negative with negative HPV.  Repeat pap 04/08/23 - negative with negative HPV.  Mammogram - 04/25/23 - Birads I.  Scheduled for f/u mammogram 05/20/24.    Thyroid  fullness Assessment & Plan: Had thyroid  ultrasound.  Saw endocrinology.  No further w/up. Follow tsh.   Orders: -     TSH; Future -     Comprehensive metabolic panel with GFR; Future -     CBC with Differential/Platelet; Future  Screening cholesterol level -     Lipid panel; Future  Encounter for screening mammogram for malignant neoplasm of breast -     3D Screening Mammogram, Left and Right; Future  Anxiety Assessment & Plan: Overall appears to be doing well.  Follow.    Other orders -     ALPRAZolam ; Take 1 tablet (0.25 mg total) by mouth daily as needed. for anxiety  Dispense:  30 tablet; Refill: 0     Allena Hamilton, MD

## 2024-04-14 NOTE — Assessment & Plan Note (Addendum)
 Physical today 04/14/24.  PAP 02/2020 - negative with negative HPV.  Repeat pap 04/08/23 - negative with negative HPV.  Mammogram - 04/25/23 - Birads I.  Scheduled for f/u mammogram 05/20/24.

## 2024-04-20 ENCOUNTER — Encounter: Payer: Self-pay | Admitting: Internal Medicine

## 2024-04-20 NOTE — Assessment & Plan Note (Signed)
 Overall appears to be doing well.  Follow.

## 2024-04-20 NOTE — Assessment & Plan Note (Signed)
 Had thyroid  ultrasound.  Saw endocrinology.  No further w/up. Follow tsh.

## 2024-04-21 ENCOUNTER — Other Ambulatory Visit

## 2024-04-21 DIAGNOSIS — E0789 Other specified disorders of thyroid: Secondary | ICD-10-CM | POA: Diagnosis not present

## 2024-04-21 DIAGNOSIS — Z13220 Encounter for screening for lipoid disorders: Secondary | ICD-10-CM | POA: Diagnosis not present

## 2024-04-21 LAB — CBC WITH DIFFERENTIAL/PLATELET
Basophils Absolute: 0 K/uL (ref 0.0–0.1)
Basophils Relative: 0.3 % (ref 0.0–3.0)
Eosinophils Absolute: 0.1 K/uL (ref 0.0–0.7)
Eosinophils Relative: 1 % (ref 0.0–5.0)
HCT: 40.9 % (ref 36.0–46.0)
Hemoglobin: 13.7 g/dL (ref 12.0–15.0)
Lymphocytes Relative: 24.7 % (ref 12.0–46.0)
Lymphs Abs: 1.6 K/uL (ref 0.7–4.0)
MCHC: 33.4 g/dL (ref 30.0–36.0)
MCV: 91.3 fl (ref 78.0–100.0)
Monocytes Absolute: 0.4 K/uL (ref 0.1–1.0)
Monocytes Relative: 5.7 % (ref 3.0–12.0)
Neutro Abs: 4.4 K/uL (ref 1.4–7.7)
Neutrophils Relative %: 68.3 % (ref 43.0–77.0)
Platelets: 180 K/uL (ref 150.0–400.0)
RBC: 4.48 Mil/uL (ref 3.87–5.11)
RDW: 13.2 % (ref 11.5–15.5)
WBC: 6.5 K/uL (ref 4.0–10.5)

## 2024-04-21 LAB — COMPREHENSIVE METABOLIC PANEL WITH GFR
ALT: 11 U/L (ref 0–35)
AST: 13 U/L (ref 0–37)
Albumin: 4.5 g/dL (ref 3.5–5.2)
Alkaline Phosphatase: 43 U/L (ref 39–117)
BUN: 16 mg/dL (ref 6–23)
CO2: 25 meq/L (ref 19–32)
Calcium: 8.8 mg/dL (ref 8.4–10.5)
Chloride: 104 meq/L (ref 96–112)
Creatinine, Ser: 0.79 mg/dL (ref 0.40–1.20)
GFR: 93.74 mL/min (ref >60.00)
Glucose, Bld: 83 mg/dL (ref 70–99)
Potassium: 4.2 meq/L (ref 3.5–5.1)
Sodium: 138 meq/L (ref 135–145)
Total Bilirubin: 0.7 mg/dL (ref 0.2–1.2)
Total Protein: 7 g/dL (ref 6.0–8.3)

## 2024-04-21 LAB — LIPID PANEL
Cholesterol: 177 mg/dL (ref 0–200)
HDL: 52 mg/dL (ref >39.00)
LDL Cholesterol: 109 mg/dL — ABNORMAL HIGH (ref 0–99)
NonHDL: 124.78
Total CHOL/HDL Ratio: 3
Triglycerides: 77 mg/dL (ref 0.0–149.0)
VLDL: 15.4 mg/dL (ref 0.0–40.0)

## 2024-04-21 LAB — TSH: TSH: 2.65 u[IU]/mL (ref 0.35–5.50)

## 2024-04-22 ENCOUNTER — Ambulatory Visit: Payer: Self-pay | Admitting: Internal Medicine

## 2024-05-20 ENCOUNTER — Inpatient Hospital Stay: Admission: RE | Admit: 2024-05-20 | Discharge: 2024-05-20 | Attending: Internal Medicine | Admitting: Internal Medicine

## 2024-05-20 DIAGNOSIS — Z1231 Encounter for screening mammogram for malignant neoplasm of breast: Secondary | ICD-10-CM | POA: Diagnosis present

## 2025-04-16 ENCOUNTER — Encounter: Admitting: Internal Medicine
